# Patient Record
Sex: Female | Born: 1955 | Race: White | Hispanic: No | Smoking: Current every day smoker
Health system: Southern US, Community
[De-identification: ages and names within clinical notes are randomized; demographics above are authoritative.]

## PROBLEM LIST (undated history)

## (undated) DIAGNOSIS — K589 Irritable bowel syndrome without diarrhea: Secondary | ICD-10-CM

## (undated) DIAGNOSIS — IMO0002 Reserved for concepts with insufficient information to code with codable children: Secondary | ICD-10-CM

## (undated) DIAGNOSIS — F32A Depression, unspecified: Secondary | ICD-10-CM

## (undated) DIAGNOSIS — M549 Dorsalgia, unspecified: Secondary | ICD-10-CM

## (undated) DIAGNOSIS — G2581 Restless legs syndrome: Secondary | ICD-10-CM

## (undated) DIAGNOSIS — I10 Essential (primary) hypertension: Secondary | ICD-10-CM

## (undated) DIAGNOSIS — K5790 Diverticulosis of intestine, part unspecified, without perforation or abscess without bleeding: Secondary | ICD-10-CM

## (undated) DIAGNOSIS — M1990 Unspecified osteoarthritis, unspecified site: Secondary | ICD-10-CM

## (undated) DIAGNOSIS — M797 Fibromyalgia: Secondary | ICD-10-CM

## (undated) DIAGNOSIS — M5030 Other cervical disc degeneration, unspecified cervical region: Secondary | ICD-10-CM

## (undated) DIAGNOSIS — G8929 Other chronic pain: Secondary | ICD-10-CM

## (undated) DIAGNOSIS — M51369 Other intervertebral disc degeneration, lumbar region without mention of lumbar back pain or lower extremity pain: Secondary | ICD-10-CM

## (undated) DIAGNOSIS — F329 Major depressive disorder, single episode, unspecified: Secondary | ICD-10-CM

## (undated) DIAGNOSIS — F419 Anxiety disorder, unspecified: Secondary | ICD-10-CM

## (undated) DIAGNOSIS — M5136 Other intervertebral disc degeneration, lumbar region: Secondary | ICD-10-CM

## (undated) DIAGNOSIS — K5792 Diverticulitis of intestine, part unspecified, without perforation or abscess without bleeding: Secondary | ICD-10-CM

## (undated) HISTORY — PX: OTHER SURGICAL HISTORY: SHX169

## (undated) HISTORY — PX: ABDOMINAL HYSTERECTOMY: SHX81

---

## 1980-10-16 HISTORY — PX: ABDOMINAL HYSTERECTOMY: SHX81

## 1998-05-14 ENCOUNTER — Emergency Department (HOSPITAL_COMMUNITY): Admission: EM | Admit: 1998-05-14 | Discharge: 1998-05-14 | Payer: Self-pay | Admitting: Emergency Medicine

## 2001-04-22 ENCOUNTER — Other Ambulatory Visit: Admission: RE | Admit: 2001-04-22 | Discharge: 2001-04-22 | Payer: Self-pay | Admitting: Obstetrics and Gynecology

## 2001-04-26 ENCOUNTER — Emergency Department (HOSPITAL_COMMUNITY): Admission: EM | Admit: 2001-04-26 | Discharge: 2001-04-26 | Payer: Self-pay | Admitting: Emergency Medicine

## 2001-06-21 ENCOUNTER — Emergency Department (HOSPITAL_COMMUNITY): Admission: EM | Admit: 2001-06-21 | Discharge: 2001-06-22 | Payer: Self-pay | Admitting: *Deleted

## 2001-06-21 ENCOUNTER — Encounter: Payer: Self-pay | Admitting: *Deleted

## 2002-08-22 ENCOUNTER — Emergency Department (HOSPITAL_COMMUNITY): Admission: EM | Admit: 2002-08-22 | Discharge: 2002-08-22 | Payer: Self-pay | Admitting: Emergency Medicine

## 2003-05-29 ENCOUNTER — Ambulatory Visit (HOSPITAL_COMMUNITY): Admission: RE | Admit: 2003-05-29 | Discharge: 2003-05-29 | Payer: Self-pay | Admitting: Internal Medicine

## 2003-05-29 ENCOUNTER — Encounter: Payer: Self-pay | Admitting: Internal Medicine

## 2003-06-05 ENCOUNTER — Ambulatory Visit (HOSPITAL_COMMUNITY): Admission: RE | Admit: 2003-06-05 | Discharge: 2003-06-05 | Payer: Self-pay | Admitting: Internal Medicine

## 2003-06-05 ENCOUNTER — Encounter: Payer: Self-pay | Admitting: Internal Medicine

## 2003-08-24 ENCOUNTER — Emergency Department (HOSPITAL_COMMUNITY): Admission: AD | Admit: 2003-08-24 | Discharge: 2003-08-24 | Payer: Self-pay | Admitting: Family Medicine

## 2007-03-18 ENCOUNTER — Ambulatory Visit (HOSPITAL_COMMUNITY): Admission: RE | Admit: 2007-03-18 | Discharge: 2007-03-18 | Payer: Self-pay | Admitting: Family Medicine

## 2008-08-31 ENCOUNTER — Emergency Department (HOSPITAL_COMMUNITY): Admission: EM | Admit: 2008-08-31 | Discharge: 2008-08-31 | Payer: Self-pay | Admitting: Emergency Medicine

## 2008-09-23 ENCOUNTER — Ambulatory Visit (HOSPITAL_COMMUNITY): Admission: RE | Admit: 2008-09-23 | Discharge: 2008-09-23 | Payer: Self-pay | Admitting: Family Medicine

## 2008-10-13 ENCOUNTER — Ambulatory Visit (HOSPITAL_COMMUNITY): Admission: RE | Admit: 2008-10-13 | Discharge: 2008-10-13 | Payer: Self-pay | Admitting: Family Medicine

## 2008-10-13 ENCOUNTER — Encounter: Payer: Self-pay | Admitting: Orthopedic Surgery

## 2008-11-02 ENCOUNTER — Ambulatory Visit: Payer: Self-pay | Admitting: Orthopedic Surgery

## 2008-11-02 DIAGNOSIS — M25469 Effusion, unspecified knee: Secondary | ICD-10-CM

## 2008-11-02 DIAGNOSIS — M25569 Pain in unspecified knee: Secondary | ICD-10-CM

## 2008-11-02 DIAGNOSIS — M1710 Unilateral primary osteoarthritis, unspecified knee: Secondary | ICD-10-CM

## 2008-11-02 DIAGNOSIS — IMO0002 Reserved for concepts with insufficient information to code with codable children: Secondary | ICD-10-CM | POA: Insufficient documentation

## 2008-11-04 ENCOUNTER — Telehealth: Payer: Self-pay | Admitting: Orthopedic Surgery

## 2008-11-11 ENCOUNTER — Ambulatory Visit (HOSPITAL_COMMUNITY): Admission: RE | Admit: 2008-11-11 | Discharge: 2008-11-11 | Payer: Self-pay | Admitting: Family Medicine

## 2008-11-27 ENCOUNTER — Encounter: Payer: Self-pay | Admitting: Orthopedic Surgery

## 2008-12-14 ENCOUNTER — Ambulatory Visit: Payer: Self-pay | Admitting: Orthopedic Surgery

## 2008-12-14 ENCOUNTER — Telehealth: Payer: Self-pay | Admitting: Orthopedic Surgery

## 2008-12-21 ENCOUNTER — Ambulatory Visit: Payer: Self-pay | Admitting: Orthopedic Surgery

## 2008-12-28 ENCOUNTER — Ambulatory Visit: Payer: Self-pay | Admitting: Orthopedic Surgery

## 2009-01-08 ENCOUNTER — Encounter: Payer: Self-pay | Admitting: Orthopedic Surgery

## 2009-02-03 ENCOUNTER — Ambulatory Visit: Payer: Self-pay | Admitting: Orthopedic Surgery

## 2009-06-23 ENCOUNTER — Encounter: Payer: Self-pay | Admitting: Orthopedic Surgery

## 2009-07-09 ENCOUNTER — Ambulatory Visit (HOSPITAL_COMMUNITY): Admission: RE | Admit: 2009-07-09 | Discharge: 2009-07-09 | Payer: Self-pay | Admitting: Family Medicine

## 2009-07-20 ENCOUNTER — Encounter: Payer: Self-pay | Admitting: Internal Medicine

## 2009-08-04 ENCOUNTER — Ambulatory Visit: Payer: Self-pay | Admitting: Orthopedic Surgery

## 2009-08-04 ENCOUNTER — Encounter: Admission: RE | Admit: 2009-08-04 | Discharge: 2009-08-04 | Payer: Self-pay | Admitting: Neurosurgery

## 2009-08-09 ENCOUNTER — Ambulatory Visit: Payer: Self-pay | Admitting: Internal Medicine

## 2009-08-23 ENCOUNTER — Ambulatory Visit: Payer: Self-pay | Admitting: Internal Medicine

## 2009-09-20 ENCOUNTER — Encounter: Admission: RE | Admit: 2009-09-20 | Discharge: 2009-09-20 | Payer: Self-pay | Admitting: Neurosurgery

## 2009-10-13 ENCOUNTER — Ambulatory Visit (HOSPITAL_COMMUNITY): Admission: RE | Admit: 2009-10-13 | Discharge: 2009-10-13 | Payer: Self-pay | Admitting: Family Medicine

## 2009-11-02 ENCOUNTER — Encounter: Admission: RE | Admit: 2009-11-02 | Discharge: 2009-11-02 | Payer: Self-pay | Admitting: Neurosurgery

## 2009-12-10 ENCOUNTER — Emergency Department (HOSPITAL_COMMUNITY): Admission: EM | Admit: 2009-12-10 | Discharge: 2009-12-10 | Payer: Self-pay | Admitting: Emergency Medicine

## 2009-12-13 ENCOUNTER — Ambulatory Visit (HOSPITAL_COMMUNITY): Admission: RE | Admit: 2009-12-13 | Discharge: 2009-12-13 | Payer: Self-pay | Admitting: Family Medicine

## 2010-01-11 ENCOUNTER — Ambulatory Visit: Payer: Self-pay | Admitting: Orthopedic Surgery

## 2010-01-11 DIAGNOSIS — M479 Spondylosis, unspecified: Secondary | ICD-10-CM | POA: Insufficient documentation

## 2010-01-11 DIAGNOSIS — M5126 Other intervertebral disc displacement, lumbar region: Secondary | ICD-10-CM

## 2010-01-11 DIAGNOSIS — M549 Dorsalgia, unspecified: Secondary | ICD-10-CM | POA: Insufficient documentation

## 2010-01-11 DIAGNOSIS — M4800 Spinal stenosis, site unspecified: Secondary | ICD-10-CM

## 2010-02-04 ENCOUNTER — Ambulatory Visit (HOSPITAL_COMMUNITY): Admission: RE | Admit: 2010-02-04 | Discharge: 2010-02-04 | Payer: Self-pay | Admitting: Neurosurgery

## 2010-03-01 ENCOUNTER — Encounter: Admission: RE | Admit: 2010-03-01 | Discharge: 2010-03-01 | Payer: Self-pay | Admitting: Neurosurgery

## 2010-04-07 ENCOUNTER — Encounter: Payer: Self-pay | Admitting: Orthopedic Surgery

## 2010-04-26 ENCOUNTER — Encounter: Payer: Self-pay | Admitting: Orthopedic Surgery

## 2010-07-26 ENCOUNTER — Encounter: Admission: RE | Admit: 2010-07-26 | Discharge: 2010-07-26 | Payer: Self-pay | Admitting: Neurosurgery

## 2010-08-29 ENCOUNTER — Emergency Department (HOSPITAL_COMMUNITY): Admission: EM | Admit: 2010-08-29 | Discharge: 2010-08-29 | Payer: Self-pay | Admitting: Emergency Medicine

## 2010-10-04 ENCOUNTER — Encounter
Admission: RE | Admit: 2010-10-04 | Discharge: 2010-10-04 | Payer: Self-pay | Source: Home / Self Care | Attending: Neurosurgery | Admitting: Neurosurgery

## 2010-11-15 NOTE — Letter (Signed)
Summary: [facility] discount letter  [facility] discount letter   Imported By: Cammie Sickle 01/11/2010 13:51:42  _____________________________________________________________________  External Attachment:    Type:   Image     Comment:   External Document

## 2010-11-15 NOTE — Letter (Signed)
Summary: Generic Letter  [facility]  [address]  [place], [place] [address]   Phone: [phone]  Fax: [phone]    04/26/2010  Kris BATES [address] [place], [place]  [address]  History of Present Illness: 55 year old female with multiple problems which include depression, fibromyalgia, arthritis of her knees, chronic pain, small herniated disc at L4-L5 and L5 and S1 with central canal stenosis moderate at L4 and 5, degenerative facet arthritis at L3-L4 L4-L5 and L5-S1.    She has some mild arthritis on x-ray.  Meds: Zanaflex, Norco 10, Relafen 750, Potassium,   (tried Lyrica)     Impression & Recommendations:  Problem # 1:  KNEE, ARTHRITIS, DEGEN./OSTEO (ICD-715.96)  This is not a patient who is a surgical candidate because of her depression, fibromyalgia, and chronic pain.  Her x-rays do not warrant knee surgery at this time.   Sincerely,   Fuller Canada MD

## 2010-11-15 NOTE — Assessment & Plan Note (Signed)
Summary: bi knee pain needs xr/[facility] discount/bsf   Visit Type:  Follow-up Referring Provider:  self  CC:  bilateral knee OA.  History of Present Illness: 55 year old female with multiple problems which include depression, fibromyalgia, arthritis of her knees, chronic pain, small herniated disc at L4-L5 and L5 and S1 with central canal stenosis moderate at L4 and 5, degenerative facet arthritis at L3-L4 L4-L5 and L5-S1.  He comes in today complaining that her legs are cramping her LEFT leg is giving way and she has severe increasing pain radiating down her legs associated with numbness and tingling  She has had epidural injections or it she's had injections in her knees.  She's had MRIs of her knees there is no surgical lesion there.  She has some mild arthritis on x-ray.  Meds: Zanaflex, Norco 10, Relafen 750, Potassium,   (tried Lyrica)    Has cramping pain in both legs, makes her scream ,she can see her muscles in her legs move.  Pain goes down bilateral legs, Dr. Channing Mutters is back specialist. Sees him 21st of April   Allergies: 1)  ! Sulfa  Past History:  Past Medical History: Last updated: 11/02/2008 fibromyalgia right leg is 1 inch shorter than left arthritis chronic back pain  Past Surgical History: Last updated: 11/02/2008 hysterectomy  Family History: Last updated: 11/02/2008 FH of Cancer:  Family History of Diabetes Family History of Arthritis Hx, family, asthma  Social History: Last updated: 11/02/2008 Patient is married.  unemployed  Risk Factors: Caffeine Use: 2 (11/02/2008)  Risk Factors: Smoking Status: current (11/02/2008) Packs/Day: 1/2 (11/02/2008)  Review of Systems      See HPI  Physical Exam  Additional Exam:  she appears depressed today.  Her mood is flat.  She joined to time person place.  I did check her knee to make sure there was no ligament damage  She has full range of motion no joint effusion no real tenderness.  Collateral  ligaments were stable at zero and 30.  Lachman was normal.  ACL and PCL drawer test were normal.  Skin overlying the knee was normal.  She had a good pulse in the LEFT lower extremity.     Impression & Recommendations:  Problem # 1:  KNEE, ARTHRITIS, DEGEN./OSTEO (ICD-715.96)  stable continue anti-inflammatories.  This is not a patient who is a surgical candidate because of her depression, fibromyalgia, and chronic pain.  Her x-rays do not want surgery at this time.   Her updated medication list for this problem includes:    Norco 10-325 Mg Tabs (Hydrocodone-acetaminophen) ..... One by mouth q 6 hrs as needed pain    Nabumetone 750 Mg Tabs (Nabumetone) .Marland Kitchen... 1 p two times a day  Orders: Est. Patient Level III ([address])  Problem # 2:  SPINAL STENOSIS (ICD-724.00) Assessment: Deteriorated  some of her back problem as spinal stenosis from multiple causes  She also has herniated disc  Recommended to Dr. Channing Mutters in April.  I did start her on Neurontin 100 mg t.i.d.  Orders: Est. Patient Level III ([address])  Medications Added to Medication List This Visit: 1)  Neurontin 100 Mg Caps (Gabapentin) .Marland Kitchen.. 1 by mouth three times a day  Patient Instructions: 1)  See Dr Channing Mutters in April  2)  Please schedule a follow-up appointment as needed. Prescriptions: NEURONTIN 100 MG CAPS (GABAPENTIN) 1 by mouth three times a day  #60 x 2   Entered and Authorized by:   Fuller Canada MD   Signed by:  Fuller Canada MD on 01/11/2010   Method used:   Print then Give to Patient   RxID:   [id number]

## 2010-11-15 NOTE — Letter (Signed)
Summary: [facility] office note Dr Dessie Coma office note Dr Channing Mutters   Imported By: Cammie Sickle 04/21/2010 18:38:33  _____________________________________________________________________  External Attachment:    Type:   Image     Comment:   External Document

## 2010-11-22 ENCOUNTER — Other Ambulatory Visit (HOSPITAL_COMMUNITY): Payer: Self-pay | Admitting: Internal Medicine

## 2010-11-22 DIAGNOSIS — Z139 Encounter for screening, unspecified: Secondary | ICD-10-CM

## 2010-11-28 ENCOUNTER — Ambulatory Visit (HOSPITAL_COMMUNITY): Admission: RE | Admit: 2010-11-28 | Payer: Self-pay | Source: Ambulatory Visit

## 2010-11-28 ENCOUNTER — Ambulatory Visit (HOSPITAL_COMMUNITY)
Admission: RE | Admit: 2010-11-28 | Discharge: 2010-11-28 | Disposition: A | Discharge status: Home / Self Care | Payer: Self-pay | Source: Ambulatory Visit | Attending: Internal Medicine | Admitting: Internal Medicine

## 2010-11-28 DIAGNOSIS — Z139 Encounter for screening, unspecified: Secondary | ICD-10-CM

## 2010-11-28 DIAGNOSIS — Z1231 Encounter for screening mammogram for malignant neoplasm of breast: Secondary | ICD-10-CM | POA: Insufficient documentation

## 2011-01-06 LAB — URINALYSIS, ROUTINE W REFLEX MICROSCOPIC
Bilirubin Urine: NEGATIVE
Urobilinogen, UA: 0.2 mg/dL (ref 0.0–1.0)
pH: 7.5 (ref 5.0–8.0)

## 2011-06-05 ENCOUNTER — Other Ambulatory Visit (HOSPITAL_COMMUNITY): Payer: Self-pay | Admitting: Internal Medicine

## 2011-06-05 ENCOUNTER — Ambulatory Visit (HOSPITAL_COMMUNITY)
Admission: RE | Admit: 2011-06-05 | Discharge: 2011-06-05 | Disposition: A | Discharge status: Home / Self Care | Payer: Medicare Other | Source: Ambulatory Visit | Attending: Internal Medicine | Admitting: Internal Medicine

## 2011-06-05 DIAGNOSIS — K6289 Other specified diseases of anus and rectum: Secondary | ICD-10-CM | POA: Insufficient documentation

## 2011-06-05 DIAGNOSIS — R1031 Right lower quadrant pain: Secondary | ICD-10-CM | POA: Insufficient documentation

## 2011-06-05 DIAGNOSIS — R1032 Left lower quadrant pain: Secondary | ICD-10-CM | POA: Insufficient documentation

## 2011-06-05 MED ORDER — IOHEXOL 300 MG/ML  SOLN
100.0000 mL | Freq: Once | INTRAMUSCULAR | Status: AC | PRN
  Administered 2011-06-05: 100 mL via INTRAVENOUS

## 2011-06-06 ENCOUNTER — Other Ambulatory Visit (HOSPITAL_COMMUNITY): Payer: Self-pay | Admitting: General Surgery

## 2011-06-08 ENCOUNTER — Ambulatory Visit (HOSPITAL_COMMUNITY)
Admission: RE | Admit: 2011-06-08 | Discharge: 2011-06-08 | Disposition: A | Discharge status: Home / Self Care | Payer: Medicare Other | Source: Ambulatory Visit | Attending: General Surgery | Admitting: General Surgery

## 2011-06-08 ENCOUNTER — Ambulatory Visit (HOSPITAL_COMMUNITY): Payer: Medicare Other

## 2011-06-08 DIAGNOSIS — R9389 Abnormal findings on diagnostic imaging of other specified body structures: Secondary | ICD-10-CM | POA: Insufficient documentation

## 2011-06-19 NOTE — Consult Note (Signed)
NAMEMICHAL, [name]                 ACCOUNT NO.:  [id number]  MEDICAL RECORD NO.:  [id number]  LOCATION:                                 FACILITY:  PHYSICIAN:  Barbaraann Barthel, M.D. DATE OF BIRTH:  [birth year]  DATE OF CONSULTATION:  06/19/2011 DATE OF DISCHARGE:                                CONSULTATION   DIAGNOSIS:  Pelvic mass.  HISTORY OF PRESENT MEDICAL ILLNESS:  The patient has had approximately a 3-week history of lower abdominal pain.  This was unaccompanied with any change in her appetite or bowel and this was unaccompanied with any history of fever and she gives no past GI history of diverticulitis. She also gives a history of endometriosis in the past in the 1980s.  In essence, she was seen by Dr. Sharyon Medicus PA.  A CT scan of the abdomen showed mass in her pelvis.  She was referred to me.  I further worked her up with a sonogram and tried to find out details of her previous history.  In essence, despite the fact that there was no history of any purging of any records, I was unable through multiple attempts to obtain any kind of pathology reports or surgical notes from the offices which used to be in the offices of Dr. Rolene Course, her physician in [place] who has recently retired.  [facility] which is now [facility] has no records of her previous surgery and can provide me no pathology reports after multiple attempts of trying to obtain these.  At any rate, she gives the history in 1982 of having had surgery for endometriosis and she underwent a total abdominal hysterectomy at that time.  She believes that she also had a bilateral salpingo-oophorectomy, although she is not certain of that fact.  With the following presentations and the findings of a complex cystic mass in her pelvis, I would raise the question of possibly having had a retained ovary which had undergone some cystic degeneration, however, we were not able to obtain any  records regarding this.  The CT scan and the sonogram, however, do not reveal any active diverticulitis or any findings suggestive of colonic mass or abscess.  Also of note is the fact that she had undergone a normal colonoscopy in 2010.  I discussed this case with the radiologist and my feeling was that this was possibly a ovarian cyst despite our inability to produce any records or possibly some cystic changes from her previous surgery and I discussed this case also with Dr. Despina Hidden who graciously has accepted my imitation to scrub in with me when I perform a laparotomy on this patient.  PHYSICAL EXAMINATION:  GENERAL:  She is a pleasant, somewhat apprehensive and nervous 55 year old white female who is 5 feet 4 inches and she weighs 198 pounds. VITAL SIGNS:  Her temperature is 98.0, her pulse is 60, respirations are 12, and blood pressure is 100/58. HEENT:  Head is normocephalic.  Extraocular movements are intact.  Her pupils are equal and reactive to light and accommodation. NECK:  Supple and cylindrical without jugular vein distention, thyromegaly, or tracheal deviation and there are no bruits auscultated and no  adenopathy appreciated. BREASTS:  Negative for masses and axillae are without any masses. HEART:  Regular.  No murmurs are appreciated. ABDOMEN:  The patient is tender in her lower abdomen. PELVIC: On speculum examination, there is absence of any retained uterine tissue.  On digital examination, there is an obvious mass that is palpable and tender.  This is also palpable on rectal examination. The stool itself is guaiac-negative.  EXTREMITIES:  Within normal limits.  She does have chronic back pain, however. NEUROLOGIC:  The patient has no history of migraines or seizures.  She does have a history of depression. ENDOCRINE SYSTEM:  No history of diabetes or thyroid disease. CARDIOPULMONARY SYSTEM:  She has a history of hypertension and she does smoke at least a half-a-pack of  cigarettes per day.  She has no history of alcohol abuse. MUSCULOSKELETAL SYSTEM:  The patient has chronic back pain.  She has undergone at least 6 injections in her back for pain and injections in her knees. OB/GYN:  She is a gravida 2, para 2, abortus zero, cesarean zero female whose last menstrual period was in 1982.  She has no family history of breast carcinoma and as stated she has in 1982 undergone a TAH, possibly BSO in 1982 for endometriosis.  Her last mammogram was normal in 2012. GI SYSTEM:  No history of hepatitis.  She has some chronic constipation and at times loose stools.  She relates these to possible dietary indiscretion and possibly some of her medications for depression.  She has no history of bright red rectal bleeding or melena.  No history of inflammatory bowel disease or spastic colon and no history of unexplained weight loss.  As stated, she had a normal colonoscopy in 2012. GU SYSTEM:  No history of dysuria or frequency or history of kidney stones.  MEDICATIONS:  The patient is allergic to SULFA and she takes bisacodyl 5 mg 2-4 daily, ciprofloxacin she was taking for what was thought to be possible intra-abdominal abscess at 500 mg daily b.i.d., oxycodone 10/325 she takes for her chronic back pain and metronidazole one twice a day as well.  When she was first seen, she was taking Flagyl and Cipro for possible intra-abdominal abscess.  This has not been borne out as stated above with CT scans and vaginal ultrasounds.  LABORATORY DATA:  Laboratory data did not show any sign of any acute inflammation with a normal white count of 8.7 and H and H of 13.2 and 40.5 with a normal differential.  Her metabolic 7 was also grossly within normal limits.  As stated, her CT scan showed a 3.8 x 5.8 x 3.5 cm complex cystic lesion, this was found to be in the posterior pelvis.  PLAN:  Therefore, this patient will be admitted via the outpatient department.  We will give her  parenteral antibiotics and have enemas or some modified bowel prep prior to exploration of this pelvic mass.  I have also discussed this with Dr. Rito Ehrlich who will place ureteral stents preoperatively and Dr. Despina Hidden who will assist me in the surgery itself.     Barbaraann Barthel, M.D.     WB/MEDQ  D:  06/19/2011  T:  06/19/2011  Job:  [id number]  cc:   Madelin Rear. Sherwood Gambler, MD Fax: [phone]  Lazaro Arms, M.D. Fax: [phone]  Hollice Espy, M.D.

## 2011-06-23 ENCOUNTER — Encounter (HOSPITAL_COMMUNITY): Payer: Self-pay

## 2011-06-23 ENCOUNTER — Encounter (HOSPITAL_COMMUNITY)
Admission: RE | Admit: 2011-06-23 | Discharge: 2011-06-23 | Disposition: A | Discharge status: Home / Self Care | Payer: Medicare Other | Source: Ambulatory Visit | Attending: General Surgery | Admitting: General Surgery

## 2011-06-23 ENCOUNTER — Other Ambulatory Visit: Payer: Self-pay

## 2011-06-23 HISTORY — DX: Dorsalgia, unspecified: M54.9

## 2011-06-23 HISTORY — DX: Restless legs syndrome: G25.81

## 2011-06-23 HISTORY — DX: Fibromyalgia: M79.7

## 2011-06-23 HISTORY — DX: Other chronic pain: G89.29

## 2011-06-23 HISTORY — DX: Anxiety disorder, unspecified: F41.9

## 2011-06-23 HISTORY — DX: Essential (primary) hypertension: I10

## 2011-06-23 HISTORY — DX: Depression, unspecified: F32.A

## 2011-06-23 HISTORY — DX: Irritable bowel syndrome, unspecified: K58.9

## 2011-06-23 HISTORY — DX: Major depressive disorder, single episode, unspecified: F32.9

## 2011-06-23 LAB — DIFFERENTIAL
Basophils Absolute: 0.1 10*3/uL (ref 0.0–0.1)
Eosinophils Relative: 3 % (ref 0–5)
Lymphocytes Relative: 32 % (ref 12–46)
Monocytes Absolute: 0.7 10*3/uL (ref 0.1–1.0)

## 2011-06-23 LAB — BASIC METABOLIC PANEL
BUN: 9 mg/dL (ref 6–23)
CO2: 26 mEq/L (ref 19–32)
Calcium: 9.5 mg/dL (ref 8.4–10.5)
Creatinine, Ser: 0.68 mg/dL (ref 0.50–1.10)

## 2011-06-23 LAB — CBC
HCT: 40.2 % (ref 36.0–46.0)
MCHC: 33.3 g/dL (ref 30.0–36.0)
MCV: 92.2 fL (ref 78.0–100.0)
RDW: 13.4 % (ref 11.5–15.5)

## 2011-06-23 LAB — SURGICAL PCR SCREEN: MRSA, PCR: NEGATIVE

## 2011-06-23 NOTE — Patient Instructions (Signed)
20 [name]  06/23/2011   Your procedure is scheduled on:  06/30/11  Report to [facility] at [place] AM.  Call this number if you have problems the morning of surgery: [phone]   Remember:   Do not eat food:After Midnight.  Do not drink clear liquids: After Midnight.  Take these medicines the morning of surgery with A SIP OF WATER: prozac, klonopin, lopressor & enalapril   Do not wear jewelry, make-up or nail polish.  Do not wear lotions, powders, or perfumes. You may wear deodorant.  Do not shave 48 hours prior to surgery.  Do not bring valuables to the hospital.  Contacts, dentures or bridgework may not be worn into surgery.  Leave suitcase in the car. After surgery it may be brought to your room.  For patients admitted to the hospital, checkout time is 11:00 AM the day of discharge.   Patients discharged the day of surgery will not be allowed to drive home.  Name and phone number of your driver: family  Special Instructions: CHG Shower Use Special Wash: 1/2 bottle night before surgery and 1/2 bottle morning of surgery.   Please read over the following fact sheets that you were given: Pain Booklet, MRSA Information, Surgical Site Infection Prevention, Anesthesia Post-op Instructions and Care and Recovery After Surgery   PATIENT INSTRUCTIONS POST-ANESTHESIA  IMMEDIATELY FOLLOWING SURGERY:  Do not drive or operate machinery for the first twenty four hours after surgery.  Do not make any important decisions for twenty four hours after surgery or while taking narcotic pain medications or sedatives.  If you develop intractable nausea and vomiting or a severe headache please notify your doctor immediately.  FOLLOW-UP:  Please make an appointment with your surgeon as instructed. You do not need to follow up with anesthesia unless specifically instructed to do so.  WOUND CARE INSTRUCTIONS (if applicable):  Keep a dry clean dressing on the anesthesia/puncture wound site if there is  drainage.  Once the wound has quit draining you may leave it open to air.  Generally you should leave the bandage intact for twenty four hours unless there is drainage.  If the epidural site drains for more than 36-48 hours please call the anesthesia department.  QUESTIONS?:  Please feel free to call your physician or the hospital operator if you have any questions, and they will be happy to assist you.     [facility] Anesthesia Department [address] [place] [phone]

## 2011-06-30 ENCOUNTER — Encounter (HOSPITAL_COMMUNITY): Payer: Self-pay | Admitting: Anesthesiology

## 2011-06-30 ENCOUNTER — Inpatient Hospital Stay (HOSPITAL_COMMUNITY): Payer: Medicare Other | Admitting: Anesthesiology

## 2011-06-30 ENCOUNTER — Encounter (HOSPITAL_COMMUNITY)
Admission: RE | Disposition: A | Discharge status: Home / Self Care | Payer: Self-pay | Source: Ambulatory Visit | Attending: General Surgery

## 2011-06-30 ENCOUNTER — Other Ambulatory Visit: Payer: Self-pay | Admitting: General Surgery

## 2011-06-30 ENCOUNTER — Encounter (HOSPITAL_COMMUNITY): Payer: Self-pay | Admitting: *Deleted

## 2011-06-30 ENCOUNTER — Inpatient Hospital Stay (HOSPITAL_COMMUNITY)
Admission: RE | Admit: 2011-06-30 | Discharge: 2011-07-03 | DRG: 750 | Disposition: A | Discharge status: Home / Self Care | Payer: Medicare Other | Source: Ambulatory Visit | Attending: General Surgery | Admitting: General Surgery

## 2011-06-30 DIAGNOSIS — M25469 Effusion, unspecified knee: Secondary | ICD-10-CM

## 2011-06-30 DIAGNOSIS — M5126 Other intervertebral disc displacement, lumbar region: Secondary | ICD-10-CM

## 2011-06-30 DIAGNOSIS — M4800 Spinal stenosis, site unspecified: Secondary | ICD-10-CM

## 2011-06-30 DIAGNOSIS — Z01812 Encounter for preprocedural laboratory examination: Secondary | ICD-10-CM

## 2011-06-30 DIAGNOSIS — N803 Endometriosis of pelvic peritoneum, unspecified: Principal | ICD-10-CM | POA: Diagnosis present

## 2011-06-30 DIAGNOSIS — I10 Essential (primary) hypertension: Secondary | ICD-10-CM | POA: Diagnosis present

## 2011-06-30 DIAGNOSIS — G8929 Other chronic pain: Secondary | ICD-10-CM | POA: Diagnosis present

## 2011-06-30 DIAGNOSIS — F3289 Other specified depressive episodes: Secondary | ICD-10-CM | POA: Diagnosis present

## 2011-06-30 DIAGNOSIS — Z01810 Encounter for preprocedural cardiovascular examination: Secondary | ICD-10-CM

## 2011-06-30 DIAGNOSIS — F329 Major depressive disorder, single episode, unspecified: Secondary | ICD-10-CM | POA: Diagnosis present

## 2011-06-30 DIAGNOSIS — M25569 Pain in unspecified knee: Secondary | ICD-10-CM

## 2011-06-30 DIAGNOSIS — M479 Spondylosis, unspecified: Secondary | ICD-10-CM

## 2011-06-30 DIAGNOSIS — G2581 Restless legs syndrome: Secondary | ICD-10-CM | POA: Diagnosis present

## 2011-06-30 DIAGNOSIS — M1710 Unilateral primary osteoarthritis, unspecified knee: Secondary | ICD-10-CM

## 2011-06-30 DIAGNOSIS — M549 Dorsalgia, unspecified: Secondary | ICD-10-CM | POA: Diagnosis present

## 2011-06-30 DIAGNOSIS — N35919 Unspecified urethral stricture, male, unspecified site: Secondary | ICD-10-CM | POA: Diagnosis present

## 2011-06-30 HISTORY — PX: CYSTOSCOPY W/ URETERAL STENT PLACEMENT: SHX1429

## 2011-06-30 HISTORY — PX: LAPAROTOMY: SHX154

## 2011-06-30 SURGERY — LAPAROTOMY, EXPLORATORY
Anesthesia: General | Wound class: Clean

## 2011-06-30 MED ORDER — ONDANSETRON HCL 4 MG/2ML IJ SOLN
4.0000 mg | Freq: Once | INTRAMUSCULAR | Status: AC
  Administered 2011-06-30: 4 mg via INTRAVENOUS

## 2011-06-30 MED ORDER — GLYCOPYRROLATE 0.2 MG/ML IJ SOLN
INTRAMUSCULAR | Status: AC
  Administered 2011-06-30: 0.2 mg via INTRAVENOUS
  Filled 2011-06-30: qty 1

## 2011-06-30 MED ORDER — FENTANYL CITRATE 0.05 MG/ML IJ SOLN
INTRAMUSCULAR | Status: AC
  Administered 2011-06-30: 50 ug via INTRAVENOUS
  Filled 2011-06-30: qty 2

## 2011-06-30 MED ORDER — DEXTROSE 5 % IV SOLN
INTRAVENOUS | Status: AC
  Filled 2011-06-30: qty 1

## 2011-06-30 MED ORDER — FENTANYL CITRATE 0.05 MG/ML IJ SOLN
INTRAMUSCULAR | Status: DC | PRN
  Administered 2011-06-30 (×2): 50 ug via INTRAVENOUS

## 2011-06-30 MED ORDER — ACETAMINOPHEN 650 MG RE SUPP
650.0000 mg | Freq: Four times a day (QID) | RECTAL | Status: DC | PRN
  Administered 2011-07-02: 650 mg via RECTAL
  Filled 2011-06-30: qty 1

## 2011-06-30 MED ORDER — GLYCOPYRROLATE 0.2 MG/ML IJ SOLN
0.2000 mg | Freq: Once | INTRAMUSCULAR | Status: AC | PRN
  Administered 2011-06-30: 0.2 mg via INTRAVENOUS

## 2011-06-30 MED ORDER — SODIUM CHLORIDE 0.9 % IV SOLN
INTRAVENOUS | Status: DC
  Administered 2011-06-30 – 2011-07-01 (×2): via INTRAVENOUS
  Administered 2011-07-01 (×2): 1000 mL via INTRAVENOUS
  Administered 2011-07-02 – 2011-07-03 (×4): via INTRAVENOUS

## 2011-06-30 MED ORDER — SUFENTANIL CITRATE 50 MCG/ML IV SOLN
INTRAVENOUS | Status: AC
  Filled 2011-06-30: qty 1

## 2011-06-30 MED ORDER — LORAZEPAM 2 MG/ML IJ SOLN
0.5000 mg | Freq: Four times a day (QID) | INTRAMUSCULAR | Status: DC | PRN
  Administered 2011-06-30 – 2011-07-03 (×4): 0.5 mg via INTRAVENOUS
  Filled 2011-06-30 (×4): qty 1

## 2011-06-30 MED ORDER — ACETAMINOPHEN 325 MG PO TABS
650.0000 mg | ORAL_TABLET | Freq: Four times a day (QID) | ORAL | Status: DC | PRN
  Administered 2011-07-01 – 2011-07-03 (×3): 650 mg via ORAL
  Filled 2011-06-30 (×3): qty 2

## 2011-06-30 MED ORDER — FLUOXETINE HCL 20 MG PO CAPS
20.0000 mg | ORAL_CAPSULE | Freq: Two times a day (BID) | ORAL | Status: DC
  Administered 2011-06-30 – 2011-07-03 (×6): 20 mg via ORAL
  Filled 2011-06-30 (×6): qty 1

## 2011-06-30 MED ORDER — GLYCOPYRROLATE 0.2 MG/ML IJ SOLN
INTRAMUSCULAR | Status: AC
  Filled 2011-06-30: qty 1

## 2011-06-30 MED ORDER — GLYCOPYRROLATE 0.2 MG/ML IJ SOLN
INTRAMUSCULAR | Status: DC | PRN
  Administered 2011-06-30 (×3): 0.2 mg via INTRAVENOUS

## 2011-06-30 MED ORDER — NEOSTIGMINE METHYLSULFATE 1 MG/ML IJ SOLN
INTRAMUSCULAR | Status: AC
  Filled 2011-06-30: qty 10

## 2011-06-30 MED ORDER — PROPOFOL 10 MG/ML IV EMUL
INTRAVENOUS | Status: DC | PRN
  Administered 2011-06-30: 120 mg via INTRAVENOUS

## 2011-06-30 MED ORDER — PROMETHAZINE HCL 25 MG RE SUPP
25.0000 mg | Freq: Four times a day (QID) | RECTAL | Status: DC | PRN
  Administered 2011-06-30 – 2011-07-02 (×3): 25 mg via RECTAL
  Filled 2011-06-30 (×3): qty 1

## 2011-06-30 MED ORDER — FENTANYL CITRATE 0.05 MG/ML IJ SOLN
25.0000 ug | INTRAMUSCULAR | Status: DC | PRN
  Administered 2011-06-30 (×3): 50 ug via INTRAVENOUS

## 2011-06-30 MED ORDER — ENALAPRIL MALEATE 5 MG PO TABS
5.0000 mg | ORAL_TABLET | Freq: Every day | ORAL | Status: DC
  Administered 2011-07-01 – 2011-07-03 (×3): 5 mg via ORAL
  Filled 2011-06-30 (×3): qty 1

## 2011-06-30 MED ORDER — ONDANSETRON HCL 4 MG/2ML IJ SOLN
4.0000 mg | Freq: Four times a day (QID) | INTRAMUSCULAR | Status: DC | PRN
  Administered 2011-06-30 – 2011-07-02 (×3): 4 mg via INTRAVENOUS
  Filled 2011-06-30 (×4): qty 2

## 2011-06-30 MED ORDER — CLONAZEPAM 0.5 MG PO TABS: 0.5000 mg | ORAL_TABLET | Freq: Every evening | ORAL | Status: DC | PRN

## 2011-06-30 MED ORDER — MORPHINE SULFATE 2 MG/ML IJ SOLN
2.0000 mg | INTRAMUSCULAR | Status: DC | PRN
  Administered 2011-06-30 – 2011-07-01 (×7): 2 mg via INTRAVENOUS
  Filled 2011-06-30 (×7): qty 1

## 2011-06-30 MED ORDER — METOPROLOL TARTRATE 25 MG PO TABS
25.0000 mg | ORAL_TABLET | Freq: Two times a day (BID) | ORAL | Status: DC
  Administered 2011-06-30 – 2011-07-03 (×6): 25 mg via ORAL
  Filled 2011-06-30 (×6): qty 1

## 2011-06-30 MED ORDER — SUFENTANIL CITRATE 50 MCG/ML IV SOLN
INTRAVENOUS | Status: DC | PRN
  Administered 2011-06-30 (×3): 5 ug via INTRAVENOUS
  Administered 2011-06-30: 10 ug via INTRAVENOUS
  Administered 2011-06-30: 15 ug via INTRAVENOUS
  Administered 2011-06-30: 10 ug via INTRAVENOUS

## 2011-06-30 MED ORDER — LACTATED RINGERS IV SOLN: INTRAVENOUS | Status: DC

## 2011-06-30 MED ORDER — VECURONIUM BROMIDE 10 MG IV SOLR
INTRAVENOUS | Status: DC | PRN
  Administered 2011-06-30: 1 mg via INTRAVENOUS
  Administered 2011-06-30: 6 mg via INTRAVENOUS
  Administered 2011-06-30: 3 mg via INTRAVENOUS

## 2011-06-30 MED ORDER — MIDAZOLAM HCL 2 MG/2ML IJ SOLN
INTRAMUSCULAR | Status: AC
  Administered 2011-06-30: 2 mg via INTRAVENOUS
  Filled 2011-06-30: qty 2

## 2011-06-30 MED ORDER — MIDAZOLAM HCL 2 MG/2ML IJ SOLN
1.0000 mg | INTRAMUSCULAR | Status: DC | PRN
  Administered 2011-06-30: 2 mg via INTRAVENOUS

## 2011-06-30 MED ORDER — ACETAMINOPHEN 325 MG PO TABS: 325.0000 mg | ORAL_TABLET | ORAL | Status: DC | PRN

## 2011-06-30 MED ORDER — SODIUM CHLORIDE 0.9 % IR SOLN
Status: DC | PRN
  Administered 2011-06-30: 1000 mL

## 2011-06-30 MED ORDER — NICOTINE 14 MG/24HR TD PT24
14.0000 mg | MEDICATED_PATCH | Freq: Every day | TRANSDERMAL | Status: DC
  Administered 2011-07-02 – 2011-07-03 (×2): 14 mg via TRANSDERMAL
  Filled 2011-06-30 (×3): qty 1

## 2011-06-30 MED ORDER — PROPOFOL 10 MG/ML IV EMUL
INTRAVENOUS | Status: AC
  Filled 2011-06-30: qty 20

## 2011-06-30 MED ORDER — LIDOCAINE HCL (PF) 1 % IJ SOLN
INTRAMUSCULAR | Status: AC
  Filled 2011-06-30: qty 5

## 2011-06-30 MED ORDER — ONDANSETRON HCL 4 MG/2ML IJ SOLN: 4.0000 mg | Freq: Once | INTRAMUSCULAR | Status: DC | PRN

## 2011-06-30 MED ORDER — ONDANSETRON HCL 4 MG/2ML IJ SOLN
INTRAMUSCULAR | Status: AC
  Administered 2011-06-30: 4 mg via INTRAVENOUS
  Filled 2011-06-30: qty 2

## 2011-06-30 MED ORDER — DEXTROSE 5 % IV SOLN
1.0000 g | Freq: Once | INTRAVENOUS | Status: DC
  Filled 2011-06-30: qty 1

## 2011-06-30 MED ORDER — MORPHINE SULFATE 2 MG/ML IJ SOLN
1.0000 mg | INTRAMUSCULAR | Status: DC | PRN
  Administered 2011-06-30: 1 mg via INTRAVENOUS
  Filled 2011-06-30: qty 1

## 2011-06-30 MED ORDER — NEOSTIGMINE METHYLSULFATE 1 MG/ML IJ SOLN
INTRAMUSCULAR | Status: DC | PRN
  Administered 2011-06-30 (×2): 1.25 mg via INTRAMUSCULAR

## 2011-06-30 MED ORDER — LACTATED RINGERS IV SOLN
INTRAVENOUS | Status: DC
  Administered 2011-06-30: 09:00:00 via INTRAVENOUS
  Administered 2011-06-30: 50 mL/h via INTRAVENOUS
  Administered 2011-06-30: 10:00:00 via INTRAVENOUS

## 2011-06-30 MED ORDER — FENTANYL CITRATE 0.05 MG/ML IJ SOLN
INTRAMUSCULAR | Status: AC
  Filled 2011-06-30: qty 2

## 2011-06-30 MED ORDER — ATROPINE SULFATE 0.4 MG/ML IJ SOLN
INTRAMUSCULAR | Status: AC
  Filled 2011-06-30: qty 1

## 2011-06-30 MED ORDER — VECURONIUM BROMIDE 10 MG IV SOLR
INTRAVENOUS | Status: AC
  Filled 2011-06-30: qty 10

## 2011-06-30 MED ORDER — DEXTROSE 5 % IV SOLN
1.0000 g | INTRAVENOUS | Status: DC | PRN
  Administered 2011-06-30: 1 g via INTRAVENOUS

## 2011-06-30 SURGICAL SUPPLY — 100 items
ADAPTER GOLDBERG URETERAL (ADAPTER) ×3 IMPLANT
APPLIER CLIP 11 MED OPEN (CLIP)
APPLIER CLIP 13 LRG OPEN (CLIP)
ATTRACTOMAT 16X20 MAGNETIC DRP (DRAPES) ×3 IMPLANT
BAG DRAIN URO TABLE W/ADPT NS (DRAPE) IMPLANT
BAG HAMPER (MISCELLANEOUS) ×6 IMPLANT
BARRIER SKIN 2 3/4 (OSTOMY) IMPLANT
BASKET GEMINI (MISCELLANEOUS) IMPLANT
BASKET/GRASPER STONE 3FR 13CM (MISCELLANEOUS) IMPLANT
CATH FOLEY 2WAY SLVR  5CC 16FR (CATHETERS) ×1
CATH FOLEY 2WAY SLVR 5CC 16FR (CATHETERS) ×2 IMPLANT
CATH URET WHISTLE 6FR (CATHETERS) ×6 IMPLANT
CATH WEDGE TIP 5FR (CATHETERS) ×3 IMPLANT
CELLS DAT CNTRL 66122 CELL SVR (MISCELLANEOUS) IMPLANT
CLAMP POUCH DRAINAGE QUIET (OSTOMY) IMPLANT
CLEANER TIP ELECTROSURG 2X2 (MISCELLANEOUS) ×3 IMPLANT
CLIP APPLIE 11 MED OPEN (CLIP) IMPLANT
CLIP APPLIE 13 LRG OPEN (CLIP) IMPLANT
CLOTH BEACON ORANGE TIMEOUT ST (SAFETY) ×6 IMPLANT
COVER LIGHT HANDLE STERIS (MISCELLANEOUS) ×6 IMPLANT
COVER MAYO STAND XLG (DRAPE) IMPLANT
DECANTER SPIKE VIAL GLASS SM (MISCELLANEOUS) ×3 IMPLANT
DILATOR BALLN URETERAL SET (BALLOONS) IMPLANT
DRAPE PROXIMA HALF (DRAPES) ×3 IMPLANT
DRAPE WARM FLUID 44X44 (DRAPE) ×3 IMPLANT
DRSG TEGADERM 4X10 (GAUZE/BANDAGES/DRESSINGS) ×3 IMPLANT
DRSG TEGADERM 4X4.75 (GAUZE/BANDAGES/DRESSINGS) ×3 IMPLANT
ELECT BLADE 6 FLAT ULTRCLN (ELECTRODE) ×3 IMPLANT
ELECT REM PT RETURN 9FT ADLT (ELECTROSURGICAL) ×3
ELECTRODE REM PT RTRN 9FT ADLT (ELECTROSURGICAL) ×2 IMPLANT
FLOOR PAD 36X40 (MISCELLANEOUS)
GLOVE BIOGEL PI IND STRL 7.0 (GLOVE) ×6 IMPLANT
GLOVE BIOGEL PI IND STRL 8 (GLOVE) ×2 IMPLANT
GLOVE BIOGEL PI INDICATOR 7.0 (GLOVE) ×3
GLOVE BIOGEL PI INDICATOR 8 (GLOVE) ×1
GLOVE ECLIPSE 6.5 STRL STRAW (GLOVE) ×12 IMPLANT
GLOVE ECLIPSE 7.0 STRL STRAW (GLOVE) ×9 IMPLANT
GLOVE ECLIPSE 8.0 STRL XLNG CF (GLOVE) ×3 IMPLANT
GLOVE SKINSENSE NS SZ7.0 (GLOVE) ×2
GLOVE SKINSENSE STRL SZ7.0 (GLOVE) ×4 IMPLANT
GOWN BRE IMP SLV AUR XL STRL (GOWN DISPOSABLE) ×9 IMPLANT
GOWN STRL REIN XL XLG (GOWN DISPOSABLE) ×6 IMPLANT
INST SET MAJOR GENERAL (KITS) ×3 IMPLANT
IV NS IRRIG 3000ML ARTHROMATIC (IV SOLUTION) ×6 IMPLANT
KIT ROOM TURNOVER AP CYSTO (KITS) ×3 IMPLANT
KIT ROOM TURNOVER APOR (KITS) ×3 IMPLANT
LIGASURE IMPACT 36 18CM CVD LR (INSTRUMENTS) ×3 IMPLANT
MANIFOLD NEPTUNE II (INSTRUMENTS) ×3 IMPLANT
NS IRRIG 1000ML POUR BTL (IV SOLUTION) ×9 IMPLANT
PACK ABDOMINAL MAJOR (CUSTOM PROCEDURE TRAY) ×3 IMPLANT
PACK CYSTO (CUSTOM PROCEDURE TRAY) ×3 IMPLANT
PAD ARMBOARD 7.5X6 YLW CONV (MISCELLANEOUS) ×3 IMPLANT
PAD FLOOR 36X40 (MISCELLANEOUS) IMPLANT
POUCH OSTOMY 2 3/4  H 3804 (WOUND CARE)
POUCH OSTOMY 2 PC DRNBL 2.75 (WOUND CARE) IMPLANT
RELOAD LINEAR CUT PROX 55 BLUE (ENDOMECHANICALS) IMPLANT
RELOAD PROXIMATE 75MM BLUE (ENDOMECHANICALS) IMPLANT
RETAINER VISCERA MED (MISCELLANEOUS) IMPLANT
RETRACTOR WND ALEXIS 25 LRG (MISCELLANEOUS) ×2 IMPLANT
RETRACTOR WOUND ALXS 34CM XLRG (MISCELLANEOUS) IMPLANT
RTRCTR WOUND ALEXIS 18CM MED (MISCELLANEOUS)
RTRCTR WOUND ALEXIS 25CM LRG (MISCELLANEOUS) ×3
RTRCTR WOUND ALEXIS 34CM XLRG (MISCELLANEOUS)
SET BASIN LINEN APH (SET/KITS/TRAYS/PACK) ×3 IMPLANT
SHEET LAVH (DRAPES) ×3 IMPLANT
SOL PREP PROV IODINE SCRUB 4OZ (MISCELLANEOUS) ×3 IMPLANT
SPONGE GAUZE 4X4 12PLY (GAUZE/BANDAGES/DRESSINGS) IMPLANT
SPONGE INTESTINAL PEANUT (DISPOSABLE) ×3 IMPLANT
SPONGE LAP 18X18 X RAY DECT (DISPOSABLE) ×3 IMPLANT
STAPLER AUT SUT LDS 15W (STAPLE) IMPLANT
STAPLER GUN LINEAR PROX 60 (STAPLE) IMPLANT
STAPLER PROXIMATE 55 BLUE (STAPLE) IMPLANT
STAPLER PROXIMATE 75MM BLUE (STAPLE) IMPLANT
STAPLER VISISTAT 35W (STAPLE) ×6 IMPLANT
STENT URET 6FRX26 CONTOUR (STENTS) IMPLANT
SUCTION POOLE TIP (SUCTIONS) ×3 IMPLANT
SUT CHROMIC 4 0 PS 2 18 (SUTURE) IMPLANT
SUT NOVA NAB GS-26 0 60 (SUTURE) IMPLANT
SUT PDS AB 3-0 SH 27 (SUTURE) IMPLANT
SUT PROLENE 0 CT 1 CR/8 (SUTURE) IMPLANT
SUT PROLENE 1 CT 1 30 (SUTURE) IMPLANT
SUT SILK 0 FSL (SUTURE) ×3 IMPLANT
SUT SILK 2 0 (SUTURE) ×1
SUT SILK 2-0 18XBRD TIE 12 (SUTURE) ×2 IMPLANT
SUT SILK 3 0 SH CR/8 (SUTURE) ×3 IMPLANT
SUT VIC AB 0 CT1 27 (SUTURE) ×5
SUT VIC AB 0 CT1 27XBRD ANTBC (SUTURE) ×4 IMPLANT
SUT VIC AB 0 CT1 27XCR 8 STRN (SUTURE) ×6 IMPLANT
SUT VIC AB 3-0 SH 27 (SUTURE)
SUT VIC AB 3-0 SH 27X BRD (SUTURE) IMPLANT
SUT VICRYL AB 2 0 TIES (SUTURE) ×3 IMPLANT
SUT VICRYL AB 3 0 TIES (SUTURE) IMPLANT
SYR BULB IRRIGATION 50ML (SYRINGE) ×3 IMPLANT
SYR CONTROL 10ML LL (SYRINGE) ×3 IMPLANT
SYRINGE 10CC LL (SYRINGE) ×3 IMPLANT
SYRINGE IRR TOOMEY STRL 70CC (SYRINGE) ×3 IMPLANT
TOWEL OR 17X26 4PK STRL BLUE (TOWEL DISPOSABLE) ×6 IMPLANT
TRAY FOLEY CATH 14FR (SET/KITS/TRAYS/PACK) IMPLANT
WATER STERILE IRR 1000ML POUR (IV SOLUTION) ×6 IMPLANT
WIRE GUIDE BENTSON .035 15CM (WIRE) ×3 IMPLANT

## 2011-06-30 NOTE — Progress Notes (Signed)
POST OP CHECK  Filed Vitals:   06/30/11 1351  BP: 125/81  Pulse: 58  Temp: 97.8 F (36.6 C)  Resp: 17   Awake and alert.  Considerable incisional pain.  Wound clean with min serous spotting. Pt still has a little bloody urine output, expected S/P ureteral stents.  Abdom. Soft, min NG output.  C/O post anesthesia nausea, have added phenergan sup.  Will need to ambulate.

## 2011-06-30 NOTE — Brief Op Note (Signed)
06/30/2011  10:42 AM  PATIENT:  [name]  55 y.o. female  PRE-OPERATIVE DIAGNOSIS:  Pelvic Mass  POST-OPERATIVE DIAGNOSIS:  Pelvic Mass  PROCEDURE:  Procedure(s): EXPLORATORY LAPAROTOMY CYSTOSCOPY WITH STENT REPLACEMENT  SURGEON:  Surgeon(s): Earle Gell, MD Dennie Maizes  PHYSICIAN ASSISTANT:   ASSISTANTS:Drs. Despina Hidden and Royal Pines  ANESTHESIA:   general  OR FLUID I/O:  Total I/O In: 2200 [I.V.:2200] Out: 150 [Urine:100; Blood:50]  BLOOD ADMINISTERED:none  DRAINS: none   LOCAL MEDICATIONS USED:  NONE  SPECIMEN:  Source of Specimen:  bladder inclusion cyst  DISPOSITION OF SPECIMEN:  PATHOLOGY  COUNTS:  YES  TOURNIQUET:  * No tourniquets in log *  DICTATION: .Other Dictation: Dictation Number [id number]  PLAN OF CARE: Admit to inpatient   PATIENT DISPOSITION:  PACU - hemodynamically stable.   Delay start of Pharmacological VTE agent (>24hrs) due to surgical blood loss or risk of bleeding:  not applicable     OP note dict. # [id number]

## 2011-06-30 NOTE — Op Note (Signed)
NAMEMILANNI, AYUB                 ACCOUNT NO.:  [id number]  MEDICAL RECORD NO.:  [id number]  LOCATION:  [facility]                          FACILITY:  [facility]  PHYSICIAN:  Dennie Maizes, M.D.   DATE OF BIRTH:  1955-11-03  DATE OF PROCEDURE:  06/30/2011 DATE OF DISCHARGE:                              OPERATIVE REPORT   PREOPERATIVE DIAGNOSIS:  Ovarian mass.  POSTOPERATIVE DIAGNOSIS:  Ovarian mass, urethral stenosis.  OPERATIVE PROCEDURE:  Cystoscopy, urethral dilation, bilateral ureteral stent, bilateral ureteral catheter placement.  ANESTHESIA:  General.  SURGEON:  Dennie Maizes, MD  COMPLICATIONS:  None.  DRAINS:  A 6-French ureteral catheter x2, 16-French Foley catheter in the bladder.  ESTIMATED BLOOD LOSS:  Minimal.  SPECIMEN:  None.  COMPLICATIONS:  None.  INDICATIONS FOR PROCEDURE:  A 55 year old female had chronic pelvic pain.  Evaluation revealed irregular pelvic mass.  Dr. Malvin Johns planned to do exploratory laparotomy as well as excision of pelvic mass.  He asked me to do cystoscopy and place bilateral ureteral catheters for intraoperative localization of the ureters.  DESCRIPTION OF PROCEDURE:  General anesthesia was induced and the patient was placed on the OR table in the dorsal lithotomy position. The lower abdomen and genitalia were prepped and draped in a sterile fashion.  Examination revealed urethral stenosis.  Dilation of urethra was done up to 24-French with metal sounds.  A 20-French cystoscope was then introduced into the bladder.  Bladder mucosa was mildly trabeculated.  No other abnormality was noted inside the bladder.  The trigone ureteral orifice and bladder mucosa was otherwise normal.  A 6- [place] whistle-tip ureteral catheter was then placed on the left side up to level of 20 cm.  A second catheter was placed on the right side without any difficulty.  The cystoscope was removed.  A 16-French Foley catheter was inserted in the bladder.   The ureteral catheter was affixed to the Foley catheter using silk tie and tape.  The catheters were then connected to collecting device.  Dr. Malvin Johns then proceeded with exploratory laparotomy.     Dennie Maizes, M.D.     SK/MEDQ  D:  06/30/2011  T:  06/30/2011  Job:  [id number]  cc:   Barbaraann Barthel, M.D. Fax: [phone]

## 2011-06-30 NOTE — Op Note (Signed)
[name], [name]                 ACCOUNT NO.:  [id number]  MEDICAL RECORD NO.:  [id number]  LOCATION:  [facility]                          FACILITY:  [facility]  PHYSICIAN:  Barbaraann Barthel, M.D. DATE OF BIRTH:  Aug 13, 1956  DATE OF PROCEDURE: DATE OF DISCHARGE:                              OPERATIVE REPORT   PROCEDURE:  Exploratory laparotomy with excision of inclusion cyst on dome of bladder.  SURGEON:  Barbaraann Barthel, MD.  ASSISTANT:  Dr. Despina Hidden and Dr. Rito Ehrlich.  SPECIMEN:  Pelvic mass.  Note, this is a 55 year old white female who had approximately 3-week history of abdominal pain.  This was unrelated to appetite or to her bowel movements.  She had no fever and she had persistent feeling of fullness and lower abdominal pain.  On pelvic examination and rectal examination, she had a firm mass that was easily palpable.  This CT scan showed a complex cyst in this area, which did not appeared to be an abscess in fact, she had no history of fever, chills, or an elevated white count and the possibility of an ovarian cyst was raised or possibly an inclusion cyst as well.  Of note is the fact that the patient in 1982 underwent a TAH-BSO, however, when trying to obtain those records from [place].  Those records were not available to [place] despite multiple phone calls and I have learned subsequently that there was some litigation involved and those records were frozen, at any rate they were not available to [place] after multiple telephone calls because the patient could not be certain that she had a BSO so the possibility of retained ovary was entertained.  At any rate, we obtained permission for surgery, discussing complications not limited to but including bleeding, infection, and the possibility of further surgery might be required.  I also addressed the need for stent placement as we did not know how complex the mass this was down there.  Also in possibility that this might be an ovarian  pathology.  I also had Dr. Despina Hidden available as an assistant as well.  Informed consent was obtained.  For details regarding the placement of ureteral stents, please see Dr. Chancy Milroy note.  This was done prior to my part of that procedure.  SPECIMEN:  Pelvic mass.  WOUND CLASSIFICATION:  Clean.  TECHNIQUE:  The patient was placed in the lithotomy position and after prepping with Betadine solution and draping in usual manner and after placement of Foley catheter and ureteral stents, the midline incision was carried out in the area of a previous incision.  The skin and subcutaneous tissue was incised down to the fascia which was opened in the midline.  The abdomen was then retracted with the use of a Balfour retractor and a protractor wound liner device.  The abdomen was explored.  There were some adhesions involving the small bowel and omentum in the pelvis.  These were easily lysed, and with the patient Trendelenburg, the small bowel was examined as well as a large bowel and there were no other abnormalities encountered.  There was no ovarian cyst found.  The patient had on the dome of the bladder on the right side a  remnant of a cyst that perforated during the laparotomy or the examination.  This was easily removed in fact was on a small pedicle and spontaneously was removed without any bleeding noted.  A small pedicle was then cauterized.  We checked for hemostasis and then irrigated with normal saline solution and then I closed the peritoneum with a running 0 Polysorb and then closed the fascia with interrupted figure-of-eight Polysorb sutures.  The subcu was irrigated and the skin was approximated with stapling device.  A sterile OpSite dressing was applied.  Prior to closure all sponge, needle, and instrument counts were found to be correct.  Estimated blood loss was minimal.  The patient tolerated the procedure well.  She received 20-100 mL of crystalloids intraoperatively.  No  drains were placed and she had approximately a 50 mL blood loss.     Barbaraann Barthel, M.D.     WB/MEDQ  D:  06/30/2011  T:  06/30/2011  Job:  [id number]  cc:   Madelin Rear. Sherwood Gambler, MD Fax: [phone]  Lazaro Arms, M.D. Fax: [phone]  Dr. Rito Ehrlich

## 2011-06-30 NOTE — Anesthesia Postprocedure Evaluation (Addendum)
  Anesthesia Post-op Note  Patient: [name]  Procedure(s) Performed:  EXPLORATORY LAPAROTOMY - Exploratory Laparotomy, Excision of Pelvic Mass--Procedure started at 0848.; CYSTOSCOPY WITH STENT REPLACEMENT - Urethral Dilation, Cystoscopy with Bilateral Ureteral Catheter Placement started at 0815.  Ended at [phone].  Patient Location: PACU  Anesthesia Type: General  Level of Consciousness: awake, alert , oriented and patient cooperative  Airway and Oxygen Therapy: Patient Spontanous Breathing  Post-op Pain: mild  Post-op Assessment: Post-op Vital signs reviewed, Patient's Cardiovascular Status Stable, Respiratory Function Stable, Patent Airway, No signs of Nausea or vomiting and Pain level controlled  Post-op Vital Signs: Reviewed and stable  Complications: No apparent anesthesia complications  07/02/2011  Patient doing well.  VSS.  No apparent anesthesia complications.

## 2011-06-30 NOTE — Progress Notes (Signed)
H&P dict. # [id number]  Filed Vitals:   06/30/11 7829  Temp: 98.1 F (36.7 C)    Pt for lap for pelvic mass.  Have coordinated care with [facility]ne and Uro services. Procedure and risks explained and all questions answered.   Pt. Received mech.  prep as OP.  Labs reviewed.  For surgery via OP dept. With plans for post op admission.  No significant change clinically since out pt H&P.

## 2011-06-30 NOTE — Transfer of Care (Signed)
Immediate Anesthesia Transfer of Care Note  Patient: [name]  Procedure(s) Performed:  EXPLORATORY LAPAROTOMY - Exploratory Laparotomy, Excision of Pelvic Mass--Procedure started at 0848.; CYSTOSCOPY WITH STENT REPLACEMENT - Urethral Dilation, Cystoscopy with Bilateral Ureteral Catheter Placement started at 0815.  Ended at [phone].  Patient Location: PACU  Anesthesia Type: General  Level of Consciousness: awake and oriented  Airway & Oxygen Therapy: Patient Spontanous Breathing and Patient connected to face mask oxygen  Post-op Assessment: Report given to PACU RN, Post -op Vital signs reviewed and stable and Patient moving all extremities  Post vital signs: Reviewed and stable  Complications: No apparent anesthesia complications

## 2011-06-30 NOTE — Anesthesia Procedure Notes (Addendum)
Procedure Name: Intubation Date/Time: 06/30/2011 7:54 AM Performed by: Marylene Buerger Pre-anesthesia Checklist: Emergency Drugs available, Patient identified and Patient being monitored Patient Re-evaluated:Patient Re-evaluated prior to inductionOxygen Delivery Method: Circle System Utilized Preoxygenation: Pre-oxygenation with 100% oxygen   Date/Time: 06/30/2011 8:02 AM Performed by: Marylene Buerger Intubation Type: IV induction and Circoid Pressure applied Ventilation: Mask ventilation without difficulty Laryngoscope Size: Mac and 3 Grade View: Grade I Tube type: Oral Tube size: 7.0 mm Number of attempts: 1 Placement Confirmation: breath sounds checked- equal and bilateral,  positive ETCO2 and ETT inserted through vocal cords under direct vision Secured at: 22 cm Tube secured with: Tape Dental Injury: Teeth and Oropharynx as per pre-operative assessment

## 2011-06-30 NOTE — Anesthesia Preprocedure Evaluation (Addendum)
Anesthesia Evaluation  Name, MR# and DOB Patient awake  General Assessment Comment  Reviewed: Allergy & Precautions, H&P , NPO status , Patient's Chart, lab work & pertinent test results, reviewed documented beta blocker date and time   Airway Mallampati: II TM Distance: >3 FB Neck ROM: Full    Dental No notable dental hx.    Pulmonary    pulmonary exam normalPulmonary Exam Normal     Cardiovascular hypertension, Pt. on medications and Pt. on home beta blockers Regular Normal    Neuro/Psych    (+) Anxiety, Depression,   Neuromuscular disease   GI/Hepatic/Renal   Endo/Other    Abdominal Normal abdominal exam  (+)   Musculoskeletal  (+) Fibromyalgia -  Hematology   Peds  Reproductive/Obstetrics    Anesthesia Other Findings            Anesthesia Physical Anesthesia Plan  ASA: II  Anesthesia Plan: General   Post-op Pain Management:    Induction: Intravenous  Airway Management Planned: Oral ETT  Additional Equipment:   Intra-op Plan:   Post-operative Plan: Extubation in OR  Informed Consent: I have reviewed the patients History and Physical, chart, labs and discussed the procedure including the risks, benefits and alternatives for the proposed anesthesia with the patient or authorized representative who has indicated his/her understanding and acceptance.     Plan Discussed with: CRNA  Anesthesia Plan Comments:         Anesthesia Quick Evaluation

## 2011-07-01 LAB — COMPREHENSIVE METABOLIC PANEL
Albumin: 2.8 g/dL — ABNORMAL LOW (ref 3.5–5.2)
Alkaline Phosphatase: 57 U/L (ref 39–117)
BUN: 7 mg/dL (ref 6–23)
CO2: 27 mEq/L (ref 19–32)
Chloride: 106 mEq/L (ref 96–112)
Creatinine, Ser: 0.5 mg/dL (ref 0.50–1.10)
GFR calc Af Amer: >60 mL/min (ref >60)
GFR calc non Af Amer: >60 mL/min (ref >60)
Glucose, Bld: 113 mg/dL — ABNORMAL HIGH (ref 70–99)
Total Bilirubin: 0.6 mg/dL (ref 0.3–1.2)

## 2011-07-01 LAB — CBC
Platelets: 196 10*3/uL (ref 150–400)
RBC: 3.75 MIL/uL — ABNORMAL LOW (ref 3.87–5.11)
RDW: 13.6 % (ref 11.5–15.5)
WBC: 12.1 10*3/uL — ABNORMAL HIGH (ref 4.0–10.5)

## 2011-07-01 MED ORDER — MORPHINE SULFATE (PF) 1 MG/ML IV SOLN
INTRAVENOUS | Status: DC
  Administered 2011-07-01 – 2011-07-02 (×3): 25 mg via INTRAVENOUS
  Filled 2011-07-01 (×3): qty 25

## 2011-07-01 MED ORDER — SODIUM CHLORIDE 0.9 % IJ SOLN: 9.0000 mL | INTRAMUSCULAR | Status: DC | PRN

## 2011-07-01 MED ORDER — MORPHINE SULFATE (PF) 1 MG/ML IV SOLN: INTRAVENOUS | Status: DC

## 2011-07-01 MED ORDER — NALOXONE HCL 0.4 MG/ML IJ SOLN: 0.4000 mg | INTRAMUSCULAR | Status: DC | PRN

## 2011-07-01 MED ORDER — DIPHENHYDRAMINE HCL 12.5 MG/5ML PO ELIX: 12.5000 mg | ORAL_SOLUTION | Freq: Four times a day (QID) | ORAL | Status: DC | PRN

## 2011-07-01 MED ORDER — ONDANSETRON HCL 4 MG/2ML IJ SOLN
4.0000 mg | Freq: Four times a day (QID) | INTRAMUSCULAR | Status: DC | PRN
  Administered 2011-07-02: 4 mg via INTRAVENOUS

## 2011-07-01 MED ORDER — DIPHENHYDRAMINE HCL 50 MG/ML IJ SOLN: 12.5000 mg | Freq: Four times a day (QID) | INTRAMUSCULAR | Status: DC | PRN

## 2011-07-01 MED ORDER — DOCUSATE SODIUM 100 MG PO CAPS
100.0000 mg | ORAL_CAPSULE | Freq: Two times a day (BID) | ORAL | Status: DC
  Administered 2011-07-01 – 2011-07-03 (×4): 100 mg via ORAL
  Filled 2011-07-01 (×4): qty 1

## 2011-07-01 MED ORDER — ONDANSETRON HCL 4 MG/2ML IJ SOLN: 4.0000 mg | Freq: Four times a day (QID) | INTRAMUSCULAR | Status: DC | PRN

## 2011-07-01 MED ORDER — POTASSIUM CHLORIDE 10 MEQ/100ML IV SOLN
10.0000 meq | INTRAVENOUS | Status: AC
  Administered 2011-07-01 (×3): 10 meq via INTRAVENOUS
  Filled 2011-07-01: qty 300

## 2011-07-01 NOTE — Progress Notes (Addendum)
Pt up to chair this afternoon.  Ambulated and getting up to bathroom with assist.  Tolerated well.

## 2011-07-01 NOTE — Progress Notes (Signed)
1 Day Post-Op  Subjective: Afebrile abdom less tender.  I& 0's incorrect, total ~400 cc.  BS present. No flatus.  Wound clean and redressed.  Urine much less bloody, orange in color. Will D/C foley and NG and ambulate.   Labs [k+] 3.3 will replace.  H/H 11.5/34  Doing well post op.  Objective: Vital signs in last 24 hours: Temp:  [97.8 F (36.6 C)-99.3 F (37.4 C)] 99.2 F (37.3 C) (09/15 0515) Pulse Rate:  [51-74] 65  (09/15 0904) Resp:  [11-20] 16  (09/15 0515) BP: (103-137)/(59-81) 117/71 mmHg (09/15 0904) SpO2:  [91 %-100 %] 99 % (09/15 0819) Weight:  [87.998 kg (194 lb)] 194 lb (87.998 kg) (09/14 1232) Last BM Date: 06/28/11  Intake/Output from previous day: 09/14 0701 - 09/15 0700 In: 4799 [I.V.:2200; NG/GT:2599] Out: 1875 [Urine:1625; Emesis/NG output:200; Blood:50] Intake/Output this shift:    BMET  Basename 07/01/11 0503  NA 139  K 3.3*  CL 106  CO2 27  GLUCOSE 113*  BUN 7  CREATININE 0.50  CALCIUM 8.1*   PT/INR No results found for this basename: LABPROT:2,INR:2 in the last 72 hours ABG No results found for this basename: PHART:2,PCO2:2,PO2:2,HCO3:2 in the last 72 hours  Studies/Results: No results found.  Anti-infectives: Anti-infectives     Start     Dose/Rate Route Frequency Ordered Stop   06/30/11 0715   cefTRIAXone (ROCEPHIN) 1 g in dextrose 5 % 50 mL IVPB  Status:  Discontinued        1 g 100 mL/hr over 30 Minutes Intravenous  Once 06/30/11 0703 06/30/11 1217          Assessment/Plan: s/p Procedure(s): EXPLORATORY LAPAROTOMY CYSTOSCOPY WITH STENT REPLACEMENT   LOS: 1 day    Talin Rozeboom S 07/01/2011

## 2011-07-02 LAB — BASIC METABOLIC PANEL
Calcium: 8.4 mg/dL (ref 8.4–10.5)
Creatinine, Ser: <0.47 mg/dL — ABNORMAL LOW (ref 0.50–1.10)
Glucose, Bld: 118 mg/dL — ABNORMAL HIGH (ref 70–99)
Sodium: 138 mEq/L (ref 135–145)

## 2011-07-02 LAB — CBC
Hemoglobin: 10.9 g/dL — ABNORMAL LOW (ref 12.0–15.0)
MCH: 31.1 pg (ref 26.0–34.0)
MCHC: 32.8 g/dL (ref 30.0–36.0)
MCV: 94.6 fL (ref 78.0–100.0)

## 2011-07-02 MED ORDER — ENOXAPARIN SODIUM 40 MG/0.4ML ~~LOC~~ SOLN
40.0000 mg | SUBCUTANEOUS | Status: DC
  Administered 2011-07-03: 40 mg via SUBCUTANEOUS
  Filled 2011-07-02 (×2): qty 0.4

## 2011-07-02 MED ORDER — MORPHINE SULFATE 2 MG/ML IJ SOLN: 1.0000 mg | INTRAMUSCULAR | Status: DC | PRN

## 2011-07-02 MED ORDER — OXYCODONE-ACETAMINOPHEN 5-325 MG PO TABS
1.0000 | ORAL_TABLET | Freq: Four times a day (QID) | ORAL | Status: DC | PRN
  Administered 2011-07-02: 1 via ORAL
  Administered 2011-07-02 – 2011-07-03 (×2): 2 via ORAL
  Administered 2011-07-03: 1 via ORAL
  Filled 2011-07-02 (×4): qty 2

## 2011-07-02 MED ORDER — ENSURE CLINICAL ST REVIGOR PO LIQD
237.0000 mL | Freq: Three times a day (TID) | ORAL | Status: DC
  Administered 2011-07-02 – 2011-07-03 (×3): 237 mL via ORAL

## 2011-07-02 MED ORDER — ENOXAPARIN SODIUM 30 MG/0.3ML ~~LOC~~ SOLN: 30.0000 mg | SUBCUTANEOUS | Status: DC

## 2011-07-02 NOTE — Progress Notes (Signed)
07/02/11 1830 Nurse notified by patient family this evening that patient noted to have reddened areas to sacrum/buttocks when up to bedside commode. Stated concerned about further issues and possible breakdown and requested skin protectant cream. Cream given as requested. Skin assessment performed and verified by 3 RNs, A Percell Boston, RN and J Mays,RN. Reddened, blanchable areas noted to sacrum and buttocks, skin protectant cream applied, pillows placed to reduce pressure. Patient stated was comfortable, nursing staff to continue to monitor.

## 2011-07-02 NOTE — Progress Notes (Signed)
Pt ambulated in hallways with family; While ambulating oxygen saturations dropped to 74%; pt back in bed, replaced O2 sats back up high 80s to low 90s; pt encouraged to ambulate and cough/deep breath and use incentive spirometry. MD aware

## 2011-07-02 NOTE — Progress Notes (Signed)
Encounter addended by: Glynn Octave on: 07/02/2011  5:27 PM<BR>     Documentation filed: Notes Section

## 2011-07-02 NOTE — Progress Notes (Signed)
POD # 2  abdom soft.  Wound clean and redressed. Bowel sounds audible and pt passing flatus.  Has nausea but not distended pt is very dependant on PCA pump.  Unfortunately she has chronic back pain as well as recent surgery.  She dropped her O2 sats but I think related to sedation; she has no chest pain or shortness of breath.  O2 sat now 93% on 2L/min.  Pt is a smoker but not using patch.  Labs reviewed  Results for orders placed during the hospital encounter of 06/30/11 (from the past 24 hour(s))  CBC     Status: Abnormal   Collection Time   07/02/11  5:06 AM      Component Value Range   WBC 12.0 (*) 4.0 - 10.5 (K/uL)   RBC 3.51 (*) 3.87 - 5.11 (MIL/uL)   Hemoglobin 10.9 (*) 12.0 - 15.0 (g/dL)   HCT 40.9 (*) 81.1 - 46.0 (%)   MCV 94.6  78.0 - 100.0 (fL)   MCH 31.1  26.0 - 34.0 (pg)   MCHC 32.8  30.0 - 36.0 (g/dL)   RDW 91.4  78.2 - 95.6 (%)   Platelets 166  150 - 400 (K/uL)  BASIC METABOLIC PANEL     Status: Abnormal   Collection Time   07/02/11  5:06 AM      Component Value Range   Sodium 138  135 - 145 (mEq/L)   Potassium 3.4 (*) 3.5 - 5.1 (mEq/L)   Chloride 105  96 - 112 (mEq/L)   CO2 27  19 - 32 (mEq/L)   Glucose, Bld 118 (*) 70 - 99 (mg/dL)   BUN 8  6 - 23 (mg/dL)   Creatinine, Ser <2.13 (*) 0.50 - 1.10 (mg/dL)   Calcium 8.4  8.4 - 08.6 (mg/dL)   GFR calc non Af Amer NOT CALCULATED  >60 (mL/min)   GFR calc Af Amer NOT CALCULATED  >60 (mL/min)    urine almost clear will D/C foley and encourage ambulation.

## 2011-07-03 NOTE — Discharge Summary (Signed)
  D/C Dict. #  [id number]

## 2011-07-03 NOTE — Progress Notes (Signed)
POD#3  Abdom. very soft had 2 small BMs.  Wound clean and redressed.  Pt tolerating PO well.  No leg pain or shortness of breath Filed Vitals:   07/03/11 1400  BP: 160/80  Pulse: 71  Temp: 99.3 F (37.4 C)  Resp: 18   O2 sat 98%.  D/C and F/U arranged.Marland Kitchen

## 2011-07-03 NOTE — Progress Notes (Signed)
Discharge Summary: a/o.vss. Dressing to abdomen changed clean dry intact. Saline lock removed. No complaints of distress. Discharge instructions given. Pt verbalized understanding of instructions. Left floor via wheelchair with nursing staff and family members.

## 2011-07-03 NOTE — Discharge Summary (Signed)
NAMESAWSAN, RIGGIO                 ACCOUNT NO.:  [id number]  MEDICAL RECORD NO.:  [id number]  LOCATION:  A324                          FACILITY:  [facility]  PHYSICIAN:  Barbaraann Barthel, M.D. DATE OF BIRTH:  06-Nov-1955  DATE OF ADMISSION:  06/30/2011 DATE OF DISCHARGE:  09/17/2012LH                              DISCHARGE SUMMARY   DIAGNOSIS:  Pelvic mass (pathology pending).  PROCEDURE:  Exploratory laparotomy with excision of pelvic mass on June 30, 2011.  CONSULTATIONS: 1. Dennie Maizes, MD for Urology. 2. Lazaro Arms, MD, OB/GYN Service.  NOTE:  This is a 55 year old white female who had a palpable mass on pelvic examination and rectal examination.  She had a CT scan which revealed a complex cystic structure.  There was a question whether or not this was ovarian or an inclusion cyst.  She had had a previous TAH- BSO for endometriosis in 1982.  Unfortunately, those records were not available to [place] and she was not certain whether she had had in fact a BSO.  At any rate, we made plans for excision of this, this took place uneventfully on June 30, 2011.  Preoperatively, I did enlist the help of the urologist for renal stent placements and because there might hav been an issue with ovarian pathology, I did invite Dr. Despina Hidden as an assistant.  Surgery went well without any problems.  Postoperatively, she had a low tolerance for pain and she required a PCA pump and she takes narcotics for chronic back pain and it was difficult to control her pain.  She also did have a hypotensive episode and low saturation rate when the problems with the sedation however, this improved.  We did have her on Lovenox and she did not have any shortness of breath or chest pain during this and this improved when we were able to manipulate her analgesic meds.  At any rate, she did well postoperatively and was discharged on the third postoperative day.  We removed the Foley on the  second postoperative day and there was some bleeding noted expected after ureteral placements, however, at that time, we removed the Foley.  Her urine had almost resumed into natural amber color.  At the time of discharge, she was tolerating p.o. well.  She did have two small bowel movements.  Her abdomen was completely soft and her wound was clean without any signs of infection.  We gave her explicit discharge instructions and she is to call us or go to the emergency room, should there be any acute changes.  We will follow her perioperatively.     Barbaraann Barthel, M.D.     WB/MEDQ  D:  07/03/2011  T:  07/03/2011  Job:  [id number]  cc:   Madelin Rear. Sherwood Gambler, MD Fax: [phone]  Lazaro Arms, M.D. Fax: [phone]  Dennie Maizes, M.D. Fax: [phone]

## 2011-07-05 ENCOUNTER — Encounter (HOSPITAL_COMMUNITY): Payer: Self-pay | Admitting: General Surgery

## 2011-10-26 ENCOUNTER — Other Ambulatory Visit: Payer: Self-pay | Admitting: Neurosurgery

## 2011-10-26 DIAGNOSIS — M47812 Spondylosis without myelopathy or radiculopathy, cervical region: Secondary | ICD-10-CM

## 2012-02-05 ENCOUNTER — Other Ambulatory Visit: Payer: Self-pay | Admitting: Neurosurgery

## 2012-02-05 DIAGNOSIS — M47812 Spondylosis without myelopathy or radiculopathy, cervical region: Secondary | ICD-10-CM

## 2012-02-09 ENCOUNTER — Other Ambulatory Visit: Payer: Self-pay | Admitting: Neurosurgery

## 2012-02-09 DIAGNOSIS — M47816 Spondylosis without myelopathy or radiculopathy, lumbar region: Secondary | ICD-10-CM

## 2012-02-19 ENCOUNTER — Ambulatory Visit
Admission: RE | Admit: 2012-02-19 | Discharge: 2012-02-19 | Disposition: A | Discharge status: Home / Self Care | Payer: Medicare Other | Source: Ambulatory Visit | Attending: Neurosurgery | Admitting: Neurosurgery

## 2012-02-19 ENCOUNTER — Other Ambulatory Visit: Payer: Medicare Other

## 2012-02-19 VITALS — BP 135/77 | HR 69

## 2012-02-19 DIAGNOSIS — M47816 Spondylosis without myelopathy or radiculopathy, lumbar region: Secondary | ICD-10-CM

## 2012-02-19 MED ORDER — METHYLPREDNISOLONE ACETATE 40 MG/ML INJ SUSP (RADIOLOG
120.0000 mg | Freq: Once | INTRAMUSCULAR | Status: AC
  Administered 2012-02-19: 120 mg via EPIDURAL

## 2012-02-19 MED ORDER — IOHEXOL 180 MG/ML  SOLN
1.0000 mL | Freq: Once | INTRAMUSCULAR | Status: AC | PRN
  Administered 2012-02-19: 1 mL via EPIDURAL

## 2012-02-19 NOTE — Discharge Instructions (Signed)

## 2014-12-21 ENCOUNTER — Telehealth: Payer: Self-pay | Admitting: Orthopedic Surgery

## 2014-12-21 NOTE — Telephone Encounter (Addendum)
Patient called to inquire as to whether she can return here for "regular" steroid injections in both knees.  She was last seen 08/04/2009.  [place] that time[place] why she's not returned here.  States has had epidural steroid injections in her back, and said she has had both "regular steroid" and also Synvisc injections in both knees while under Dr Harrison's care in 2010.  Please advise.  Patient ph# is [phone] Marland Kitchen(*Also, patient has had a last name change (marriage), which we will need to update if we schedule.  Is it possible for her to return here for "regular" steroid injections in her knees.  Her insurance is traditional Medicare, which I have relayed, does NOT cover Synvisc or Orthovisc.  Please advise.

## 2014-12-30 NOTE — Telephone Encounter (Signed)
no

## 2014-12-31 NOTE — Telephone Encounter (Signed)
Relayed to patient per Dr Mort SawyersHarrison's response.

## 2015-03-22 ENCOUNTER — Inpatient Hospital Stay: Admission: RE | Admit: 2015-03-22 | Payer: Self-pay | Source: Ambulatory Visit

## 2015-03-25 ENCOUNTER — Encounter: Payer: Self-pay | Admitting: *Deleted

## 2015-03-25 DIAGNOSIS — Z9049 Acquired absence of other specified parts of digestive tract: Secondary | ICD-10-CM | POA: Diagnosis not present

## 2015-03-25 DIAGNOSIS — F329 Major depressive disorder, single episode, unspecified: Secondary | ICD-10-CM | POA: Diagnosis not present

## 2015-03-25 DIAGNOSIS — H2511 Age-related nuclear cataract, right eye: Secondary | ICD-10-CM | POA: Diagnosis present

## 2015-03-25 DIAGNOSIS — M797 Fibromyalgia: Secondary | ICD-10-CM | POA: Diagnosis not present

## 2015-03-25 DIAGNOSIS — G2581 Restless legs syndrome: Secondary | ICD-10-CM | POA: Diagnosis not present

## 2015-03-25 DIAGNOSIS — F419 Anxiety disorder, unspecified: Secondary | ICD-10-CM | POA: Diagnosis not present

## 2015-03-25 DIAGNOSIS — F17200 Nicotine dependence, unspecified, uncomplicated: Secondary | ICD-10-CM | POA: Diagnosis not present

## 2015-03-25 DIAGNOSIS — G8929 Other chronic pain: Secondary | ICD-10-CM | POA: Diagnosis not present

## 2015-03-25 DIAGNOSIS — M1990 Unspecified osteoarthritis, unspecified site: Secondary | ICD-10-CM | POA: Diagnosis not present

## 2015-03-25 DIAGNOSIS — Z882 Allergy status to sulfonamides status: Secondary | ICD-10-CM | POA: Diagnosis not present

## 2015-03-25 DIAGNOSIS — M549 Dorsalgia, unspecified: Secondary | ICD-10-CM | POA: Diagnosis not present

## 2015-03-29 ENCOUNTER — Encounter: Payer: Self-pay | Admitting: *Deleted

## 2015-03-29 ENCOUNTER — Encounter
Admission: RE | Disposition: A | Discharge status: Home / Self Care | Payer: Self-pay | Source: Ambulatory Visit | Attending: Ophthalmology

## 2015-03-29 ENCOUNTER — Ambulatory Visit: Payer: Medicare Other | Admitting: *Deleted

## 2015-03-29 ENCOUNTER — Ambulatory Visit
Admission: RE | Admit: 2015-03-29 | Discharge: 2015-03-29 | Disposition: A | Discharge status: Home / Self Care | Payer: Medicare Other | Source: Ambulatory Visit | Attending: Ophthalmology | Admitting: Ophthalmology

## 2015-03-29 DIAGNOSIS — H2511 Age-related nuclear cataract, right eye: Secondary | ICD-10-CM | POA: Diagnosis not present

## 2015-03-29 DIAGNOSIS — F17200 Nicotine dependence, unspecified, uncomplicated: Secondary | ICD-10-CM | POA: Insufficient documentation

## 2015-03-29 DIAGNOSIS — G8929 Other chronic pain: Secondary | ICD-10-CM | POA: Insufficient documentation

## 2015-03-29 DIAGNOSIS — M1990 Unspecified osteoarthritis, unspecified site: Secondary | ICD-10-CM | POA: Insufficient documentation

## 2015-03-29 DIAGNOSIS — M797 Fibromyalgia: Secondary | ICD-10-CM | POA: Insufficient documentation

## 2015-03-29 DIAGNOSIS — F329 Major depressive disorder, single episode, unspecified: Secondary | ICD-10-CM | POA: Insufficient documentation

## 2015-03-29 DIAGNOSIS — Z882 Allergy status to sulfonamides status: Secondary | ICD-10-CM | POA: Insufficient documentation

## 2015-03-29 DIAGNOSIS — Z9049 Acquired absence of other specified parts of digestive tract: Secondary | ICD-10-CM | POA: Insufficient documentation

## 2015-03-29 DIAGNOSIS — G2581 Restless legs syndrome: Secondary | ICD-10-CM | POA: Insufficient documentation

## 2015-03-29 DIAGNOSIS — M549 Dorsalgia, unspecified: Secondary | ICD-10-CM | POA: Insufficient documentation

## 2015-03-29 DIAGNOSIS — F419 Anxiety disorder, unspecified: Secondary | ICD-10-CM | POA: Insufficient documentation

## 2015-03-29 HISTORY — DX: Other intervertebral disc degeneration, lumbar region: M51.36

## 2015-03-29 HISTORY — DX: Reserved for concepts with insufficient information to code with codable children: IMO0002

## 2015-03-29 HISTORY — DX: Other chronic pain: G89.29

## 2015-03-29 HISTORY — DX: Other intervertebral disc degeneration, lumbar region without mention of lumbar back pain or lower extremity pain: M51.369

## 2015-03-29 HISTORY — DX: Unspecified osteoarthritis, unspecified site: M19.90

## 2015-03-29 HISTORY — DX: Restless legs syndrome: G25.81

## 2015-03-29 HISTORY — PX: CATARACT EXTRACTION W/PHACO: SHX586

## 2015-03-29 HISTORY — DX: Dorsalgia, unspecified: M54.9

## 2015-03-29 SURGERY — PHACOEMULSIFICATION, CATARACT, WITH IOL INSERTION
Anesthesia: Monitor Anesthesia Care | Site: Eye | Laterality: Right

## 2015-03-29 MED ORDER — LIDOCAINE HCL (PF) 1 % IJ SOLN
INTRAOCULAR | Status: DC | PRN
  Administered 2015-03-29: .5 mL via OPHTHALMIC

## 2015-03-29 MED ORDER — PHENYLEPHRINE HCL 10 % OP SOLN
OPHTHALMIC | Status: AC
  Administered 2015-03-29: 1 [drp] via OPHTHALMIC
  Filled 2015-03-29: qty 5

## 2015-03-29 MED ORDER — EPINEPHRINE HCL 1 MG/ML IJ SOLN
INTRAMUSCULAR | Status: AC
  Filled 2015-03-29: qty 2

## 2015-03-29 MED ORDER — MOXIFLOXACIN HCL 0.5 % OP SOLN - NO CHARGE
OPHTHALMIC | Status: DC | PRN
  Administered 2015-03-29: 1 [drp] via OPHTHALMIC

## 2015-03-29 MED ORDER — CYCLOPENTOLATE HCL 2 % OP SOLN
1.0000 [drp] | OPHTHALMIC | Status: AC
  Administered 2015-03-29 (×4): 1 [drp] via OPHTHALMIC

## 2015-03-29 MED ORDER — NA CHONDROIT SULF-NA HYALURON 40-17 MG/ML IO SOLN
INTRAOCULAR | Status: AC
  Filled 2015-03-29: qty 1

## 2015-03-29 MED ORDER — TETRACAINE HCL 0.5 % OP SOLN
OPHTHALMIC | Status: AC
  Filled 2015-03-29: qty 2

## 2015-03-29 MED ORDER — SODIUM CHLORIDE 0.9 % IV SOLN
INTRAVENOUS | Status: DC
  Administered 2015-03-29: 500 mL via INTRAVENOUS

## 2015-03-29 MED ORDER — TETRACAINE HCL 0.5 % OP SOLN
OPHTHALMIC | Status: DC | PRN
  Administered 2015-03-29: 1 [drp] via OPHTHALMIC

## 2015-03-29 MED ORDER — CYCLOPENTOLATE HCL 2 % OP SOLN
OPHTHALMIC | Status: AC
  Administered 2015-03-29: 1 [drp] via OPHTHALMIC
  Filled 2015-03-29: qty 2

## 2015-03-29 MED ORDER — HYALURONIDASE HUMAN 150 UNIT/ML IJ SOLN
INTRAMUSCULAR | Status: AC
  Filled 2015-03-29: qty 1

## 2015-03-29 MED ORDER — MOXIFLOXACIN HCL 0.5 % OP SOLN
1.0000 [drp] | OPHTHALMIC | Status: AC
  Administered 2015-03-29 (×3): 1 [drp] via OPHTHALMIC

## 2015-03-29 MED ORDER — BUPIVACAINE HCL (PF) 0.75 % IJ SOLN
INTRAMUSCULAR | Status: DC | PRN
  Administered 2015-03-29: 5 mL

## 2015-03-29 MED ORDER — CARBACHOL 0.01 % IO SOLN
INTRAOCULAR | Status: DC | PRN
  Administered 2015-03-29: 0.5 mL via INTRAOCULAR

## 2015-03-29 MED ORDER — CEFUROXIME OPHTHALMIC INJECTION 1 MG/0.1 ML
INJECTION | OPHTHALMIC | Status: AC
  Filled 2015-03-29: qty 0.1

## 2015-03-29 MED ORDER — PHENYLEPHRINE HCL 10 % OP SOLN
1.0000 [drp] | OPHTHALMIC | Status: AC
  Administered 2015-03-29 (×4): 1 [drp] via OPHTHALMIC

## 2015-03-29 MED ORDER — MOXIFLOXACIN HCL 0.5 % OP SOLN
OPHTHALMIC | Status: AC
  Administered 2015-03-29: 1 [drp] via OPHTHALMIC
  Filled 2015-03-29: qty 3

## 2015-03-29 MED ORDER — CEFUROXIME OPHTHALMIC INJECTION 1 MG/0.1 ML
INJECTION | OPHTHALMIC | Status: DC | PRN
  Administered 2015-03-29: 0.1 mL via INTRACAMERAL

## 2015-03-29 MED ORDER — EPINEPHRINE HCL 1 MG/ML IJ SOLN
INTRAOCULAR | Status: DC | PRN
  Administered 2015-03-29: 200 mL

## 2015-03-29 MED ORDER — BUPIVACAINE HCL (PF) 0.75 % IJ SOLN
INTRAMUSCULAR | Status: AC
  Filled 2015-03-29: qty 10

## 2015-03-29 MED ORDER — LIDOCAINE HCL (PF) 4 % IJ SOLN
INTRAMUSCULAR | Status: AC
  Filled 2015-03-29: qty 10

## 2015-03-29 MED ORDER — ALFENTANIL 500 MCG/ML IJ INJ
INJECTION | INTRAMUSCULAR | Status: DC | PRN
  Administered 2015-03-29: 500 ug via INTRAVENOUS

## 2015-03-29 MED ORDER — MIDAZOLAM HCL 2 MG/2ML IJ SOLN
INTRAMUSCULAR | Status: DC | PRN
Start: 2015-03-29 — End: 2015-03-29
  Administered 2015-03-29: .5 mg via INTRAVENOUS

## 2015-03-29 MED ORDER — NA CHONDROIT SULF-NA HYALURON 40-17 MG/ML IO SOLN
INTRAOCULAR | Status: DC | PRN
  Administered 2015-03-29: 1 mL via INTRAOCULAR

## 2015-03-29 SURGICAL SUPPLY — 26 items
ACTIVE FMS ×3 IMPLANT
CORD BIP STRL DISP 12FT (MISCELLANEOUS) ×3 IMPLANT
DRAPE XRAY CASSETTE 23X24 (DRAPES) ×3 IMPLANT
ERASER HMR WETFIELD 18G (MISCELLANEOUS) ×3 IMPLANT
GLOVE BIO SURGEON STRL SZ8 (GLOVE) ×3 IMPLANT
GLOVE SURG LX 6.5 MICRO (GLOVE) ×2
GLOVE SURG LX 8.0 MICRO (GLOVE) ×2
GLOVE SURG LX STRL 6.5 MICRO (GLOVE) ×1 IMPLANT
GLOVE SURG LX STRL 8.0 MICRO (GLOVE) ×1 IMPLANT
GOWN STRL REUS W/ TWL LRG LVL3 (GOWN DISPOSABLE) ×1 IMPLANT
GOWN STRL REUS W/ TWL XL LVL3 (GOWN DISPOSABLE) ×1 IMPLANT
GOWN STRL REUS W/TWL LRG LVL3 (GOWN DISPOSABLE) ×2
GOWN STRL REUS W/TWL XL LVL3 (GOWN DISPOSABLE) ×2
LENS IOL ACRYSERT 19.5 (Intraocular Lens) ×3 IMPLANT
PACK CATARACT (MISCELLANEOUS) ×3 IMPLANT
PACK CATARACT DINGLEDEIN LX (MISCELLANEOUS) ×3 IMPLANT
PACK EYE AFTER SURG (MISCELLANEOUS) ×3 IMPLANT
SHLD EYE VISITEC  UNIV (MISCELLANEOUS) ×3 IMPLANT
SOL PREP PVP 2OZ (MISCELLANEOUS) ×3
SOLUTION PREP PVP 2OZ (MISCELLANEOUS) ×1 IMPLANT
SUT SILK 5-0 (SUTURE) ×3 IMPLANT
SYR 5ML LL (SYRINGE) ×3 IMPLANT
SYR TB 1ML 27GX1/2 LL (SYRINGE) ×3 IMPLANT
WATER STERILE IRR 1000ML POUR (IV SOLUTION) ×3 IMPLANT
WIPE NON LINTING 3.25X3.25 (MISCELLANEOUS) ×3 IMPLANT
acrysof lens 19.5 ×3 IMPLANT

## 2015-03-29 NOTE — Interval H&P Note (Signed)
History and Physical Interval Note:  03/29/2015 10:20 AM  [name]  has presented today for surgery, with the diagnosis of CATARACT  The various methods of treatment have been discussed with the patient and family. After consideration of risks, benefits and other options for treatment, the patient has consented to  Procedure(s): CATARACT EXTRACTION PHACO AND INTRAOCULAR LENS PLACEMENT (IOC) (Right) as a surgical intervention .  The patient's history has been reviewed, patient examined, no change in status, stable for surgery.  I have reviewed the patient's chart and labs.  Questions were answered to the patient's satisfaction.     [name]

## 2015-03-29 NOTE — Transfer of Care (Signed)
Immediate Anesthesia Transfer of Care Note  Patient: [name]  Procedure(s) Performed: Procedure(s) with comments: CATARACT EXTRACTION PHACO AND INTRAOCULAR LENS PLACEMENT (IOC) (Right) - [place] 00:40 AP% 20.2 CDE 15.83  Patient Location: PACU  Anesthesia Type:MAC  Level of Consciousness: awake, alert  and oriented  Airway & Oxygen Therapy: Patient Spontanous Breathing  Post-op Assessment: Report given to RN and Post -op Vital signs reviewed and stable  Post vital signs: Reviewed and stable  Last Vitals:  Filed Vitals:   03/29/15 1104  BP: 150/79  Pulse:   Temp: 36.2 C  Resp: 14    Complications: No apparent anesthesia complications

## 2015-03-29 NOTE — Anesthesia Postprocedure Evaluation (Signed)
  Anesthesia Post-op Note  Patient: [name]  Procedure(s) Performed: Procedure(s) with comments: CATARACT EXTRACTION PHACO AND INTRAOCULAR LENS PLACEMENT (IOC) (Right) - [place] 00:40 AP% 20.2 CDE 15.83  Anesthesia type:MAC  Patient location: PACU  Post pain: Pain level controlled  Post assessment: Post-op Vital signs reviewed, Patient's Cardiovascular Status Stable, Respiratory Function Stable, Patent Airway and No signs of Nausea or vomiting  Post vital signs: Reviewed and stable  Last Vitals:  Filed Vitals:   03/29/15 1110  BP: 140/73  Pulse: 54  Temp:   Resp: 16    Level of consciousness: awake, alert  and patient cooperative  Complications: No apparent anesthesia complications

## 2015-03-29 NOTE — Anesthesia Preprocedure Evaluation (Signed)
Anesthesia Evaluation  Patient identified by MRN, date of birth, ID band Patient awake    Reviewed: Allergy & Precautions, NPO status , Patient's Chart, lab work & pertinent test results  Airway Mallampati: I  TM Distance: >3 FB Neck ROM: Full    Dental  (+) Teeth Intact   Pulmonary Current Smoker,    Pulmonary exam normal       Cardiovascular Normal cardiovascular exam    Neuro/Psych Restless legs--quite severe.    GI/Hepatic   Endo/Other    Renal/GU      Musculoskeletal   Abdominal (+) + obese,   Peds  Hematology   Anesthesia Other Findings   Reproductive/Obstetrics                             Anesthesia Physical Anesthesia Plan  ASA: II  Anesthesia Plan: MAC   Post-op Pain Management:    Induction:   Airway Management Planned: Nasal Cannula  Additional Equipment:   Intra-op Plan:   Post-operative Plan:   Informed Consent: I have reviewed the patients History and Physical, chart, labs and discussed the procedure including the risks, benefits and alternatives for the proposed anesthesia with the patient or authorized representative who has indicated his/her understanding and acceptance.     Plan Discussed with: CRNA  Anesthesia Plan Comments:         Anesthesia Quick Evaluation

## 2015-03-29 NOTE — H&P (Signed)
  History and physical was faxed and scanned in.   

## 2015-03-29 NOTE — Op Note (Signed)
Date of Surgery: 03/29/2015 Date of Dictation: 03/29/2015 11:02 AM Pre-operative Diagnosis:  Nuclear Sclerotic Cataract right Eye Post-operative Diagnosis: same Procedure performed: Extra-capsular Cataract Extraction (ECCE) with placement of a posterior chamber intraocular lens (IOL) right Eye IOL:  Implant Name Type Inv. Item Serial No. Manufacturer Lot No. LRB No. Used  acrysof lens 19.5     [id number]     Right 1   Anesthesia: 2% Lidocaine and 4% Marcaine in a 50/50 mixture with 10 unites/ml of Hylenex given as a peribulbar Anesthesiologist: Anesthesiologist: Linward Natal, MD CRNA: Darrol Jump, CRNA; Lily Kocher, CRNA Complications: none Estimated Blood Loss: less than 1 ml  Description of procedure:  The patient was given anesthesia and sedation via intravenous access. The patient was then prepped and draped in the usual fashion. A 25-gauge needle was bent for initiating the capsulorhexis. A 5-0 silk suture was placed through the conjunctiva superior and inferiorly to serve as bridle sutures. Hemostasis was obtained at the superior limbus using an eraser cautery. A partial thickness groove was made at the anterior surgical limbus with a 64 Beaver blade and this was dissected anteriorly with an [employer]. The anterior chamber was entered at 10 o'clock with a 1.0 mm paracentesis knife and through the lamellar dissection with a 2.6 mm Alcon keratome. DiscoVisc was injected to replace the aqueous and a continuous tear curvilinear capsulorhexis was performed using a bent 25-gauge needle.  Balance salt on a syringe was used to perform hydro-dissection and phacoemulsification was carried out using a divide and conquer technique. Procedure(s) with comments: CATARACT EXTRACTION PHACO AND INTRAOCULAR LENS PLACEMENT (IOC) (Right) - [place] 00:40 AP% 20.2 CDE 15.83. Irrigation/aspiration was used to remove the residual cortex and the capsular bag was inflated with DiscoVisc. The intraocular lens  was inserted into the capsular bag using a pre-loaded Acrysert Delivery System. Irrigation/aspiration was used to remove the residual DiscoVisc. The wound was inflated with balanced salt and checked for leaks. None were found. Miostat was injected via the paracentesis track and 0.1 ml of cefuroxime containing 1 mg of drug  was injected via the paracentesis track. The wound was checked for leaks again and none were found.   The bridal sutures were removed and two drops of Vigamox were placed on the eye. An eye shield was placed to protect the eye and the patient was discharged to the recovery area in good condition.   Persephone Schriever MD

## 2015-03-29 NOTE — Discharge Instructions (Addendum)
See handout Eye Surgery Discharge Instructions  Expect mild scratchy sensation or mild soreness. DO NOT RUB YOUR EYE!  The day of surgery:  Minimal physical activity, but bed rest is not required  No reading, computer work, or close hand work  No bending, lifting, or straining.  May watch TV  For 24 hours:  No driving, legal decisions, or alcoholic beverages  Safety precautions  Eat anything you prefer: It is better to start with liquids, then soup then solid foods.  _____ Eye patch should be worn until postoperative exam tomorrow.  ____ Solar shield eyeglasses should be worn for comfort in the sunlight/patch while sleeping  Resume all regular medications including aspirin or Coumadin if these were discontinued prior to surgery. You may shower, bathe, shave, or wash your hair. Tylenol may be taken for mild discomfort.  Call your doctor if you experience significant pain, nausea, or vomiting, fever > 101 or other signs of infection. [phone] or [phone] Specific instructions:  Follow-up Information    Follow up On 03/30/2015.   Why:  11:00

## 2015-03-29 NOTE — Anesthesia Postprocedure Evaluation (Signed)
  Anesthesia Post-op Note  Patient: [name]  Procedure(s) Performed: Procedure(s) with comments: CATARACT EXTRACTION PHACO AND INTRAOCULAR LENS PLACEMENT (IOC) (Right) - [place] 00:40 AP% 20.2 CDE 15.83  Anesthesia type:MAC  Patient location: PACU  Post pain: Pain level controlled  Post assessment: Post-op Vital signs reviewed, Patient's Cardiovascular Status Stable, Respiratory Function Stable, Patent Airway and No signs of Nausea or vomiting  Post vital signs: Reviewed and stable  Last Vitals:  Filed Vitals:   03/29/15 1104  BP: 150/79  Pulse:   Temp: 36.2 C  Resp: 14    Level of consciousness: awake, alert  and patient cooperative  Complications: No apparent anesthesia complications

## 2015-04-13 ENCOUNTER — Inpatient Hospital Stay: Admission: RE | Admit: 2015-04-13 | Payer: Medicare Other | Source: Ambulatory Visit

## 2015-04-15 ENCOUNTER — Encounter: Payer: Self-pay | Admitting: *Deleted

## 2015-04-15 DIAGNOSIS — G8929 Other chronic pain: Secondary | ICD-10-CM | POA: Diagnosis not present

## 2015-04-15 DIAGNOSIS — Z9841 Cataract extraction status, right eye: Secondary | ICD-10-CM | POA: Diagnosis not present

## 2015-04-15 DIAGNOSIS — M549 Dorsalgia, unspecified: Secondary | ICD-10-CM | POA: Diagnosis not present

## 2015-04-15 DIAGNOSIS — H2511 Age-related nuclear cataract, right eye: Secondary | ICD-10-CM | POA: Diagnosis not present

## 2015-04-15 DIAGNOSIS — F419 Anxiety disorder, unspecified: Secondary | ICD-10-CM | POA: Diagnosis not present

## 2015-04-15 DIAGNOSIS — Z882 Allergy status to sulfonamides status: Secondary | ICD-10-CM | POA: Diagnosis not present

## 2015-04-15 DIAGNOSIS — M797 Fibromyalgia: Secondary | ICD-10-CM | POA: Diagnosis not present

## 2015-04-15 DIAGNOSIS — G2581 Restless legs syndrome: Secondary | ICD-10-CM | POA: Diagnosis not present

## 2015-04-15 DIAGNOSIS — F329 Major depressive disorder, single episode, unspecified: Secondary | ICD-10-CM | POA: Diagnosis not present

## 2015-04-15 DIAGNOSIS — M1990 Unspecified osteoarthritis, unspecified site: Secondary | ICD-10-CM | POA: Diagnosis not present

## 2015-04-15 DIAGNOSIS — K5790 Diverticulosis of intestine, part unspecified, without perforation or abscess without bleeding: Secondary | ICD-10-CM | POA: Diagnosis not present

## 2015-04-15 DIAGNOSIS — Z90710 Acquired absence of both cervix and uterus: Secondary | ICD-10-CM | POA: Diagnosis not present

## 2015-04-15 DIAGNOSIS — F17200 Nicotine dependence, unspecified, uncomplicated: Secondary | ICD-10-CM | POA: Diagnosis not present

## 2015-04-25 NOTE — H&P (Signed)
  History and physical was faxed and scanned in.   

## 2015-04-26 ENCOUNTER — Encounter: Payer: Self-pay | Admitting: *Deleted

## 2015-04-26 ENCOUNTER — Ambulatory Visit: Payer: Medicare Other | Admitting: *Deleted

## 2015-04-26 ENCOUNTER — Ambulatory Visit
Admission: RE | Admit: 2015-04-26 | Discharge: 2015-04-26 | Disposition: A | Discharge status: Home / Self Care | Payer: Medicare Other | Source: Ambulatory Visit | Attending: Ophthalmology | Admitting: Ophthalmology

## 2015-04-26 ENCOUNTER — Encounter
Admission: RE | Disposition: A | Discharge status: Home / Self Care | Payer: Self-pay | Source: Ambulatory Visit | Attending: Ophthalmology

## 2015-04-26 DIAGNOSIS — G2581 Restless legs syndrome: Secondary | ICD-10-CM | POA: Insufficient documentation

## 2015-04-26 DIAGNOSIS — H2511 Age-related nuclear cataract, right eye: Secondary | ICD-10-CM | POA: Insufficient documentation

## 2015-04-26 DIAGNOSIS — F17200 Nicotine dependence, unspecified, uncomplicated: Secondary | ICD-10-CM | POA: Insufficient documentation

## 2015-04-26 DIAGNOSIS — Z882 Allergy status to sulfonamides status: Secondary | ICD-10-CM | POA: Insufficient documentation

## 2015-04-26 DIAGNOSIS — Z9841 Cataract extraction status, right eye: Secondary | ICD-10-CM | POA: Insufficient documentation

## 2015-04-26 DIAGNOSIS — M1990 Unspecified osteoarthritis, unspecified site: Secondary | ICD-10-CM | POA: Insufficient documentation

## 2015-04-26 DIAGNOSIS — F329 Major depressive disorder, single episode, unspecified: Secondary | ICD-10-CM | POA: Insufficient documentation

## 2015-04-26 DIAGNOSIS — K5790 Diverticulosis of intestine, part unspecified, without perforation or abscess without bleeding: Secondary | ICD-10-CM | POA: Insufficient documentation

## 2015-04-26 DIAGNOSIS — Z90710 Acquired absence of both cervix and uterus: Secondary | ICD-10-CM | POA: Insufficient documentation

## 2015-04-26 DIAGNOSIS — F419 Anxiety disorder, unspecified: Secondary | ICD-10-CM | POA: Insufficient documentation

## 2015-04-26 DIAGNOSIS — M549 Dorsalgia, unspecified: Secondary | ICD-10-CM | POA: Insufficient documentation

## 2015-04-26 DIAGNOSIS — G8929 Other chronic pain: Secondary | ICD-10-CM | POA: Insufficient documentation

## 2015-04-26 DIAGNOSIS — M797 Fibromyalgia: Secondary | ICD-10-CM | POA: Insufficient documentation

## 2015-04-26 HISTORY — DX: Diverticulitis of intestine, part unspecified, without perforation or abscess without bleeding: K57.92

## 2015-04-26 HISTORY — DX: Other cervical disc degeneration, unspecified cervical region: M50.30

## 2015-04-26 HISTORY — PX: CATARACT EXTRACTION W/PHACO: SHX586

## 2015-04-26 HISTORY — DX: Diverticulosis of intestine, part unspecified, without perforation or abscess without bleeding: K57.90

## 2015-04-26 SURGERY — PHACOEMULSIFICATION, CATARACT, WITH IOL INSERTION
Anesthesia: Monitor Anesthesia Care | Laterality: Left | Wound class: Clean

## 2015-04-26 MED ORDER — CYCLOPENTOLATE HCL 2 % OP SOLN
1.0000 [drp] | OPHTHALMIC | Status: AC
  Administered 2015-04-26 (×4): 1 [drp] via OPHTHALMIC

## 2015-04-26 MED ORDER — EPINEPHRINE HCL 1 MG/ML IJ SOLN
INTRAMUSCULAR | Status: AC
  Filled 2015-04-26: qty 2

## 2015-04-26 MED ORDER — MOXIFLOXACIN HCL 0.5 % OP SOLN
1.0000 [drp] | OPHTHALMIC | Status: AC
  Administered 2015-04-26 (×3): 1 [drp] via OPHTHALMIC

## 2015-04-26 MED ORDER — CEFUROXIME OPHTHALMIC INJECTION 1 MG/0.1 ML
INJECTION | OPHTHALMIC | Status: AC
  Filled 2015-04-26: qty 0.1

## 2015-04-26 MED ORDER — CEFUROXIME OPHTHALMIC INJECTION 1 MG/0.1 ML
INJECTION | OPHTHALMIC | Status: DC | PRN
  Administered 2015-04-26: 0.1 mL via INTRACAMERAL

## 2015-04-26 MED ORDER — TETRACAINE HCL 0.5 % OP SOLN
OPHTHALMIC | Status: DC | PRN
  Administered 2015-04-26: 1 [drp] via OPHTHALMIC

## 2015-04-26 MED ORDER — NA CHONDROIT SULF-NA HYALURON 40-17 MG/ML IO SOLN
INTRAOCULAR | Status: DC | PRN
  Administered 2015-04-26: 1 mL via INTRAOCULAR

## 2015-04-26 MED ORDER — PHENYLEPHRINE HCL 10 % OP SOLN
OPHTHALMIC | Status: AC
  Administered 2015-04-26: 1 [drp] via OPHTHALMIC
  Filled 2015-04-26: qty 5

## 2015-04-26 MED ORDER — PHENYLEPHRINE HCL 10 % OP SOLN
1.0000 [drp] | OPHTHALMIC | Status: AC
  Administered 2015-04-26 (×4): 1 [drp] via OPHTHALMIC

## 2015-04-26 MED ORDER — EPINEPHRINE HCL 1 MG/ML IJ SOLN
INTRAOCULAR | Status: DC | PRN
  Administered 2015-04-26: 200 mL

## 2015-04-26 MED ORDER — HYDROCODONE-ACETAMINOPHEN 10-325 MG PO TABS
1.0000 | ORAL_TABLET | Freq: Once | ORAL | Status: AC
  Administered 2015-04-26: 1 via ORAL

## 2015-04-26 MED ORDER — MOXIFLOXACIN HCL 0.5 % OP SOLN - NO CHARGE
OPHTHALMIC | Status: DC | PRN
  Administered 2015-04-26: 1 [drp] via OPHTHALMIC

## 2015-04-26 MED ORDER — TETRACAINE HCL 0.5 % OP SOLN
OPHTHALMIC | Status: AC
  Filled 2015-04-26: qty 2

## 2015-04-26 MED ORDER — LACTATED RINGERS IV SOLN
INTRAVENOUS | Status: DC
  Administered 2015-04-26: 12:00:00 via INTRAVENOUS

## 2015-04-26 MED ORDER — ALFENTANIL 500 MCG/ML IJ INJ
INJECTION | INTRAMUSCULAR | Status: DC | PRN
  Administered 2015-04-26: 500 ug via INTRAVENOUS

## 2015-04-26 MED ORDER — CARBACHOL 0.01 % IO SOLN
INTRAOCULAR | Status: DC | PRN
  Administered 2015-04-26: 0.5 mL via INTRAOCULAR

## 2015-04-26 MED ORDER — MOXIFLOXACIN HCL 0.5 % OP SOLN
OPHTHALMIC | Status: AC
  Administered 2015-04-26: 1 [drp] via OPHTHALMIC
  Filled 2015-04-26: qty 3

## 2015-04-26 MED ORDER — BUPIVACAINE HCL (PF) 0.75 % IJ SOLN
INTRAMUSCULAR | Status: AC
  Filled 2015-04-26: qty 10

## 2015-04-26 MED ORDER — HYDROCODONE-ACETAMINOPHEN 10-325 MG PO TABS
ORAL_TABLET | ORAL | Status: AC
  Filled 2015-04-26: qty 1

## 2015-04-26 MED ORDER — FENTANYL CITRATE (PF) 100 MCG/2ML IJ SOLN
INTRAMUSCULAR | Status: DC | PRN
  Administered 2015-04-26: 25 ug via INTRAVENOUS

## 2015-04-26 MED ORDER — CYCLOPENTOLATE HCL 2 % OP SOLN
OPHTHALMIC | Status: AC
  Administered 2015-04-26: 1 [drp] via OPHTHALMIC
  Filled 2015-04-26: qty 2

## 2015-04-26 MED ORDER — NA CHONDROIT SULF-NA HYALURON 40-17 MG/ML IO SOLN
INTRAOCULAR | Status: AC
  Filled 2015-04-26: qty 1

## 2015-04-26 MED ORDER — MIDAZOLAM HCL 2 MG/2ML IJ SOLN
INTRAMUSCULAR | Status: DC | PRN
  Administered 2015-04-26: 1 mg via INTRAVENOUS

## 2015-04-26 MED ORDER — HYALURONIDASE HUMAN 150 UNIT/ML IJ SOLN
INTRAMUSCULAR | Status: AC
  Filled 2015-04-26: qty 1

## 2015-04-26 MED ORDER — LIDOCAINE HCL (PF) 4 % IJ SOLN
INTRAMUSCULAR | Status: AC
  Filled 2015-04-26: qty 10

## 2015-04-26 SURGICAL SUPPLY — 25 items
CORD BIP STRL DISP 12FT (MISCELLANEOUS) ×3 IMPLANT
DRAPE XRAY CASSETTE 23X24 (DRAPES) ×3 IMPLANT
ERASER HMR WETFIELD 18G (MISCELLANEOUS) ×3 IMPLANT
GLOVE BIO SURGEON STRL SZ8 (GLOVE) ×3 IMPLANT
GLOVE SURG LX 6.5 MICRO (GLOVE) ×2
GLOVE SURG LX 8.0 MICRO (GLOVE) ×2
GLOVE SURG LX STRL 6.5 MICRO (GLOVE) ×1 IMPLANT
GLOVE SURG LX STRL 8.0 MICRO (GLOVE) ×1 IMPLANT
GOWN STRL REUS W/ TWL LRG LVL3 (GOWN DISPOSABLE) ×1 IMPLANT
GOWN STRL REUS W/ TWL XL LVL3 (GOWN DISPOSABLE) ×1 IMPLANT
GOWN STRL REUS W/TWL LRG LVL3 (GOWN DISPOSABLE) ×2
GOWN STRL REUS W/TWL XL LVL3 (GOWN DISPOSABLE) ×2
LENS IOL ACRYSERT 19.5 (Intraocular Lens) ×3 IMPLANT
PACK CATARACT (MISCELLANEOUS) ×3 IMPLANT
PACK CATARACT DINGLEDEIN LX (MISCELLANEOUS) ×3 IMPLANT
PACK EYE AFTER SURG (MISCELLANEOUS) ×3 IMPLANT
SHLD EYE VISITEC  UNIV (MISCELLANEOUS) ×3 IMPLANT
SOL PREP PVP 2OZ (MISCELLANEOUS) ×3
SOLUTION PREP PVP 2OZ (MISCELLANEOUS) ×1 IMPLANT
SUT SILK 5-0 (SUTURE) ×3 IMPLANT
SYR 5ML LL (SYRINGE) ×3 IMPLANT
SYR TB 1ML 27GX1/2 LL (SYRINGE) ×3 IMPLANT
WATER STERILE IRR 1000ML POUR (IV SOLUTION) ×3 IMPLANT
WIPE NON LINTING 3.25X3.25 (MISCELLANEOUS) ×3 IMPLANT
sn6cws 19.5 ×3 IMPLANT

## 2015-04-26 NOTE — Anesthesia Procedure Notes (Signed)
Procedure Name: MAC Date/Time: 04/26/2015 12:50 PM Performed by: Henrietta HooverPOPE, Vipul Cafarelli Pre-anesthesia Checklist: Patient identified, Emergency Drugs available, Suction available, Patient being monitored and Timeout performed Oxygen Delivery Method: Nasal cannula

## 2015-04-26 NOTE — Interval H&P Note (Signed)
History and Physical Interval Note:  04/26/2015 12:30 PM  Debra SalkLinda H Weight  has presented today for surgery, with the diagnosis of CATARACT  The various methods of treatment have been discussed with the patient and family. After consideration of risks, benefits and other options for treatment, the patient has consented to  Procedure(s): CATARACT EXTRACTION PHACO AND INTRAOCULAR LENS PLACEMENT (IOC) (Left) as a surgical intervention .  The patient's history has been reviewed, patient examined, no change in status, stable for surgery.  I have reviewed the patient's chart and labs.  Questions were answered to the patient's satisfaction.     [name]

## 2015-04-26 NOTE — Anesthesia Postprocedure Evaluation (Signed)
  Anesthesia Post-op Note  Patient: Debra SalkLinda H Pace  Procedure(s) Performed: Procedure(s) with comments: CATARACT EXTRACTION PHACO AND INTRAOCULAR LENS PLACEMENT (IOC) (Left) - us 00:44 ap 55.3 cde 18.64  Anesthesia type:MAC  Patient location: PACU  Post pain: Pain level controlled  Post assessment: Post-op Vital signs reviewed, Patient's Cardiovascular Status Stable, Respiratory Function Stable, Patent Airway and No signs of Nausea or vomiting  Post vital signs: Reviewed and stable  Last Vitals:  Filed Vitals:   04/26/15 1125  BP: 128/83  Pulse: 71  Temp: 37 C  Resp: 16    Level of consciousness: awake, alert  and patient cooperative  Complications: No apparent anesthesia complications

## 2015-04-26 NOTE — Transfer of Care (Signed)
Immediate Anesthesia Transfer of Care Note  Patient: Debra SalkLinda H Pace  Procedure(s) Performed: Procedure(s) with comments: CATARACT EXTRACTION PHACO AND INTRAOCULAR LENS PLACEMENT (IOC) (Left) - us 00:44 ap 55.3 cde 18.64  Patient Location: PACU  Anesthesia Type:MAC  Level of Consciousness: awake  Airway & Oxygen Therapy: Patient Spontanous Breathing  Post-op Assessment: Report given to RN and Post -op Vital signs reviewed and stable  Post vital signs: Reviewed and stable  Last Vitals:  Filed Vitals:   04/26/15 1125  BP: 128/83  Pulse: 71  Temp: 37 C  Resp: 16    Complications: No apparent anesthesia complications

## 2015-04-26 NOTE — Op Note (Signed)
Date of Surgery: 04/26/2015 Date of Dictation: 04/26/2015 1:25 PM Pre-operative Diagnosis:  Nuclear Sclerotic Cataract right Eye Post-operative Diagnosis: same Procedure performed: Extra-capsular Cataract Extraction (ECCE) with placement of a posterior chamber intraocular lens (IOL) right Eye IOL: * No implants in log * Anesthesia: 2% Lidocaine and 4% Marcaine in a 50/50 mixture with 10 unites/ml of Hylenex given as a peribulbar Anesthesiologist: Anesthesiologist: Linward NatalAmy Rice, MD CRNA: Henrietta HooverKimberly Pope, CRNA Complications: none Estimated Blood Loss: less than 1 ml  Description of procedure:  The patient was given anesthesia and sedation via intravenous access. The patient was then prepped and draped in the usual fashion. A 25-gauge needle was bent for initiating the capsulorhexis. A 5-0 silk suture was placed through the conjunctiva superior and inferiorly to serve as bridle sutures. Hemostasis was obtained at the superior limbus using an eraser cautery. A partial thickness groove was made at the anterior surgical limbus with a 64 Beaver blade and this was dissected anteriorly with an SYSCOlcon Crescent knife. The anterior chamber was entered at 10 o'clock with a 1.0 mm paracentesis knife and through the lamellar dissection with a 2.6 mm Alcon keratome. DiscoVisc was injected to replace the aqueous and a continuous tear curvilinear capsulorhexis was performed using a bent 25-gauge needle.  Balance salt on a syringe was used to perform hydro-dissection and phacoemulsification was carried out using a divide and conquer technique. Procedure(s) with comments: CATARACT EXTRACTION PHACO AND INTRAOCULAR LENS PLACEMENT (IOC) (Left) - us 00:44 ap 55.3 cde 18.64. Irrigation/aspiration was used to remove the residual cortex and the capsular bag was inflated with DiscoVisc. The intraocular lens was inserted into the capsular bag using a pre-loaded Monarch Delivery System. Irrigation/aspiration was used to remove the  residual DiscoVisc. The wound was inflated with balanced salt and checked for leaks. None were found. Miostat was injected via the paracentesis track and 0.1 ml of cefuroxime containing 1 mg of drug  was injected via the paracentesis track. The wound was checked for leaks again and none were found.   The bridal sutures were removed and two drops of Vigamox were placed on the eye. An eye shield was placed to protect the eye and the patient was discharged to the recovery area in good condition.   Tennelle Taflinger MD

## 2015-04-26 NOTE — Anesthesia Preprocedure Evaluation (Signed)
Anesthesia Evaluation  Patient identified by MRN, date of birth, ID band Patient awake    Reviewed: Allergy & Precautions, NPO status , Patient's Chart, lab work & pertinent test results  Airway Mallampati: II  TM Distance: >3 FB Neck ROM: Full    Dental  (+) Teeth Intact   Pulmonary Current Smoker,    Pulmonary exam normal       Cardiovascular Exercise Tolerance: Good Normal cardiovascular exam    Neuro/Psych Restless leg syndrome.    GI/Hepatic   Endo/Other    Renal/GU      Musculoskeletal   Abdominal Normal abdominal exam  (+)   Peds  Hematology   Anesthesia Other Findings   Reproductive/Obstetrics                             Anesthesia Physical Anesthesia Plan  ASA: II  Anesthesia Plan: MAC   Post-op Pain Management:    Induction: Intravenous  Airway Management Planned: Nasal Cannula  Additional Equipment:   Intra-op Plan:   Post-operative Plan:   Informed Consent: I have reviewed the patients History and Physical, chart, labs and discussed the procedure including the risks, benefits and alternatives for the proposed anesthesia with the patient or authorized representative who has indicated his/her understanding and acceptance.     Plan Discussed with: CRNA  Anesthesia Plan Comments:         Anesthesia Quick Evaluation

## 2015-04-26 NOTE — Discharge Instructions (Addendum)
See handout. Eye Surgery Discharge Instructions  Expect mild scratchy sensation or mild soreness. DO NOT RUB YOUR EYE!  The day of surgery:  Minimal physical activity, but bed rest is not required  No reading, computer work, or close hand work  No bending, lifting, or straining.  May watch TV  For 24 hours:  No driving, legal decisions, or alcoholic beverages  Safety precautions  Eat anything you prefer: It is better to start with liquids, then soup then solid foods.  _____ Eye patch should be worn until postoperative exam tomorrow.  ____ Solar shield eyeglasses should be worn for comfort in the sunlight/patch while sleeping  Resume all regular medications including aspirin or Coumadin if these were discontinued prior to surgery. You may shower, bathe, shave, or wash your hair. Tylenol may be taken for mild discomfort.  Call your doctor if you experience significant pain, nausea, or vomiting, fever > 101 or other signs of infection. [phone] or [phone] Specific instructions:  Follow-up Information    Follow up with Sallee LangeINGELDEIN,STEVEN, MD On 04/27/2015.   Specialty:  Ophthalmology   Why:  10:40   Contact information:   [address]   [place] [address] [phone]

## 2015-06-17 ENCOUNTER — Encounter (HOSPITAL_COMMUNITY): Payer: Self-pay | Admitting: General Surgery

## 2015-12-10 ENCOUNTER — Encounter: Payer: Self-pay | Admitting: Ophthalmology

## 2016-04-06 ENCOUNTER — Emergency Department
Admission: EM | Admit: 2016-04-06 | Discharge: 2016-04-06 | Disposition: A | Discharge status: Home / Self Care | Payer: Medicare Other | Attending: Emergency Medicine | Admitting: Emergency Medicine

## 2016-04-06 ENCOUNTER — Other Ambulatory Visit: Payer: Self-pay

## 2016-04-06 ENCOUNTER — Encounter: Payer: Self-pay | Admitting: Emergency Medicine

## 2016-04-06 ENCOUNTER — Emergency Department: Payer: Medicare Other

## 2016-04-06 DIAGNOSIS — M5136 Other intervertebral disc degeneration, lumbar region: Secondary | ICD-10-CM | POA: Insufficient documentation

## 2016-04-06 DIAGNOSIS — Y9339 Activity, other involving climbing, rappelling and jumping off: Secondary | ICD-10-CM | POA: Diagnosis not present

## 2016-04-06 DIAGNOSIS — M5030 Other cervical disc degeneration, unspecified cervical region: Secondary | ICD-10-CM | POA: Insufficient documentation

## 2016-04-06 DIAGNOSIS — M25561 Pain in right knee: Secondary | ICD-10-CM | POA: Insufficient documentation

## 2016-04-06 DIAGNOSIS — M545 Low back pain: Secondary | ICD-10-CM | POA: Diagnosis present

## 2016-04-06 DIAGNOSIS — Y929 Unspecified place or not applicable: Secondary | ICD-10-CM | POA: Diagnosis not present

## 2016-04-06 DIAGNOSIS — M179 Osteoarthritis of knee, unspecified: Secondary | ICD-10-CM | POA: Insufficient documentation

## 2016-04-06 DIAGNOSIS — Y999 Unspecified external cause status: Secondary | ICD-10-CM | POA: Diagnosis not present

## 2016-04-06 DIAGNOSIS — F329 Major depressive disorder, single episode, unspecified: Secondary | ICD-10-CM | POA: Diagnosis not present

## 2016-04-06 DIAGNOSIS — I10 Essential (primary) hypertension: Secondary | ICD-10-CM | POA: Diagnosis not present

## 2016-04-06 DIAGNOSIS — F17200 Nicotine dependence, unspecified, uncomplicated: Secondary | ICD-10-CM | POA: Diagnosis not present

## 2016-04-06 DIAGNOSIS — Z79899 Other long term (current) drug therapy: Secondary | ICD-10-CM | POA: Diagnosis not present

## 2016-04-06 DIAGNOSIS — W01198A Fall on same level from slipping, tripping and stumbling with subsequent striking against other object, initial encounter: Secondary | ICD-10-CM | POA: Insufficient documentation

## 2016-04-06 LAB — BASIC METABOLIC PANEL
ANION GAP: 7 (ref 5–15)
BUN: 11 mg/dL (ref 6–20)
CALCIUM: 9.5 mg/dL (ref 8.9–10.3)
CO2: 27 mmol/L (ref 22–32)
CREATININE: 0.75 mg/dL (ref 0.44–1.00)
Chloride: 104 mmol/L (ref 101–111)
Glucose, Bld: 99 mg/dL (ref 65–99)
Potassium: 4.2 mmol/L (ref 3.5–5.1)
SODIUM: 138 mmol/L (ref 135–145)

## 2016-04-06 LAB — CBC
HCT: 39.1 % (ref 35.0–47.0)
HEMOGLOBIN: 13.2 g/dL (ref 12.0–16.0)
MCH: 31.3 pg (ref 26.0–34.0)
MCHC: 33.8 g/dL (ref 32.0–36.0)
MCV: 92.6 fL (ref 80.0–100.0)
Platelets: 209 10*3/uL (ref 150–440)
RBC: 4.22 MIL/uL (ref 3.80–5.20)
RDW: 13.7 % (ref 11.5–14.5)
WBC: 8.1 10*3/uL (ref 3.6–11.0)

## 2016-04-06 IMAGING — DX DG KNEE COMPLETE 4+V*R*
4 series · 4 of 4 positions shown · non-contrast
Comparison: None.

CLINICAL DATA: Jumped on by a dog causing her to fall.

EXAM:
RIGHT KNEE - COMPLETE 4+ VIEW

[knee ap]
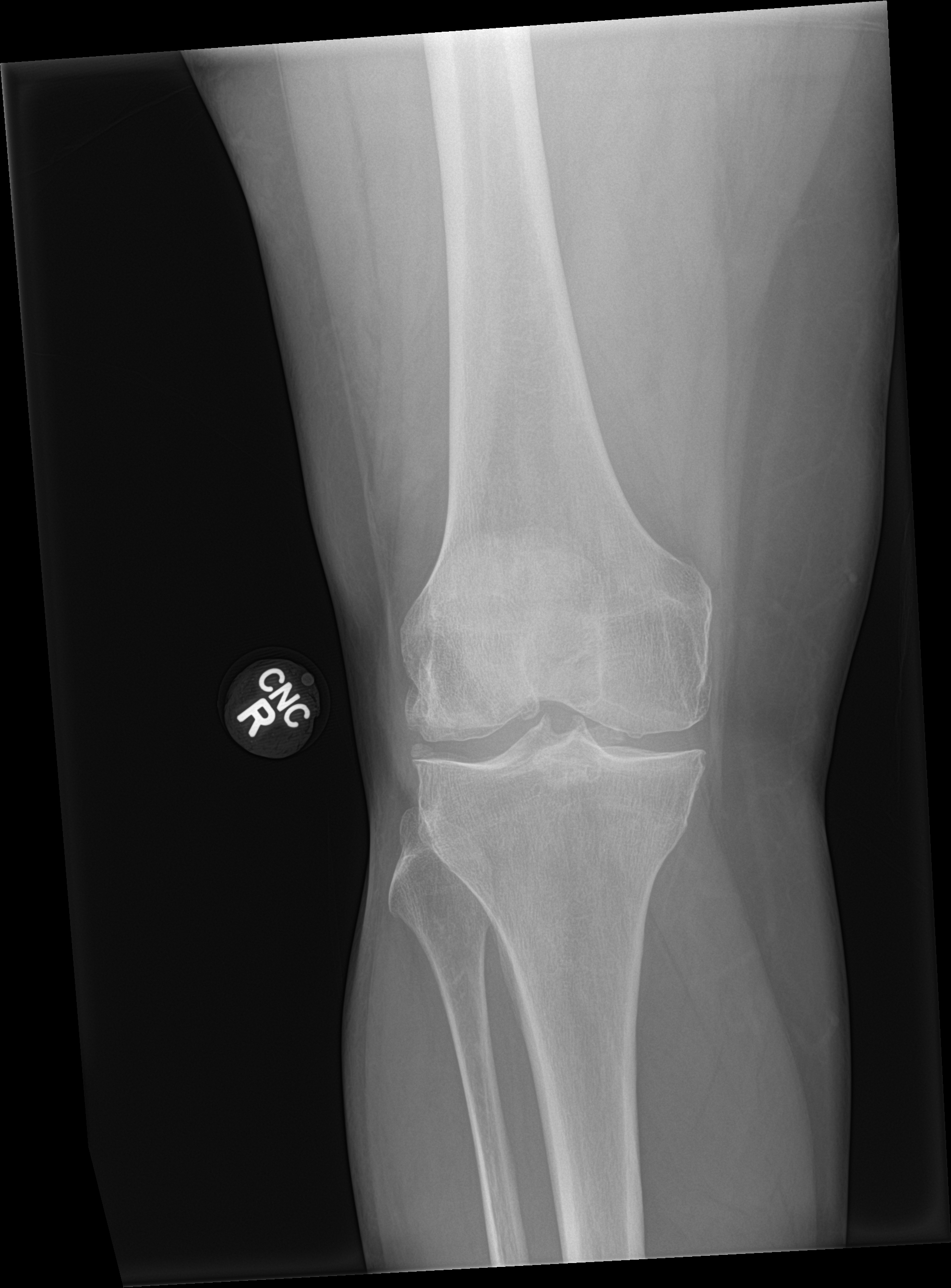

[knee lat]
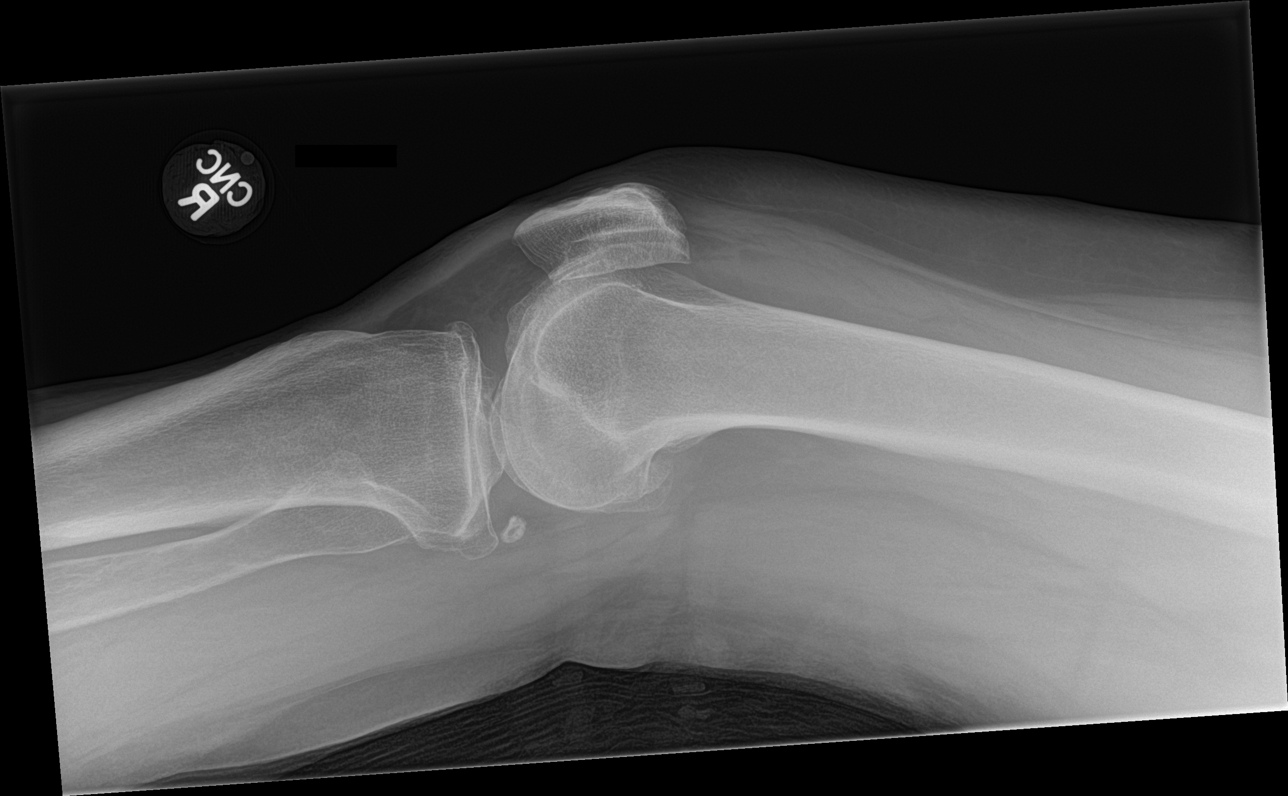

[knee obl (1 of 2)]
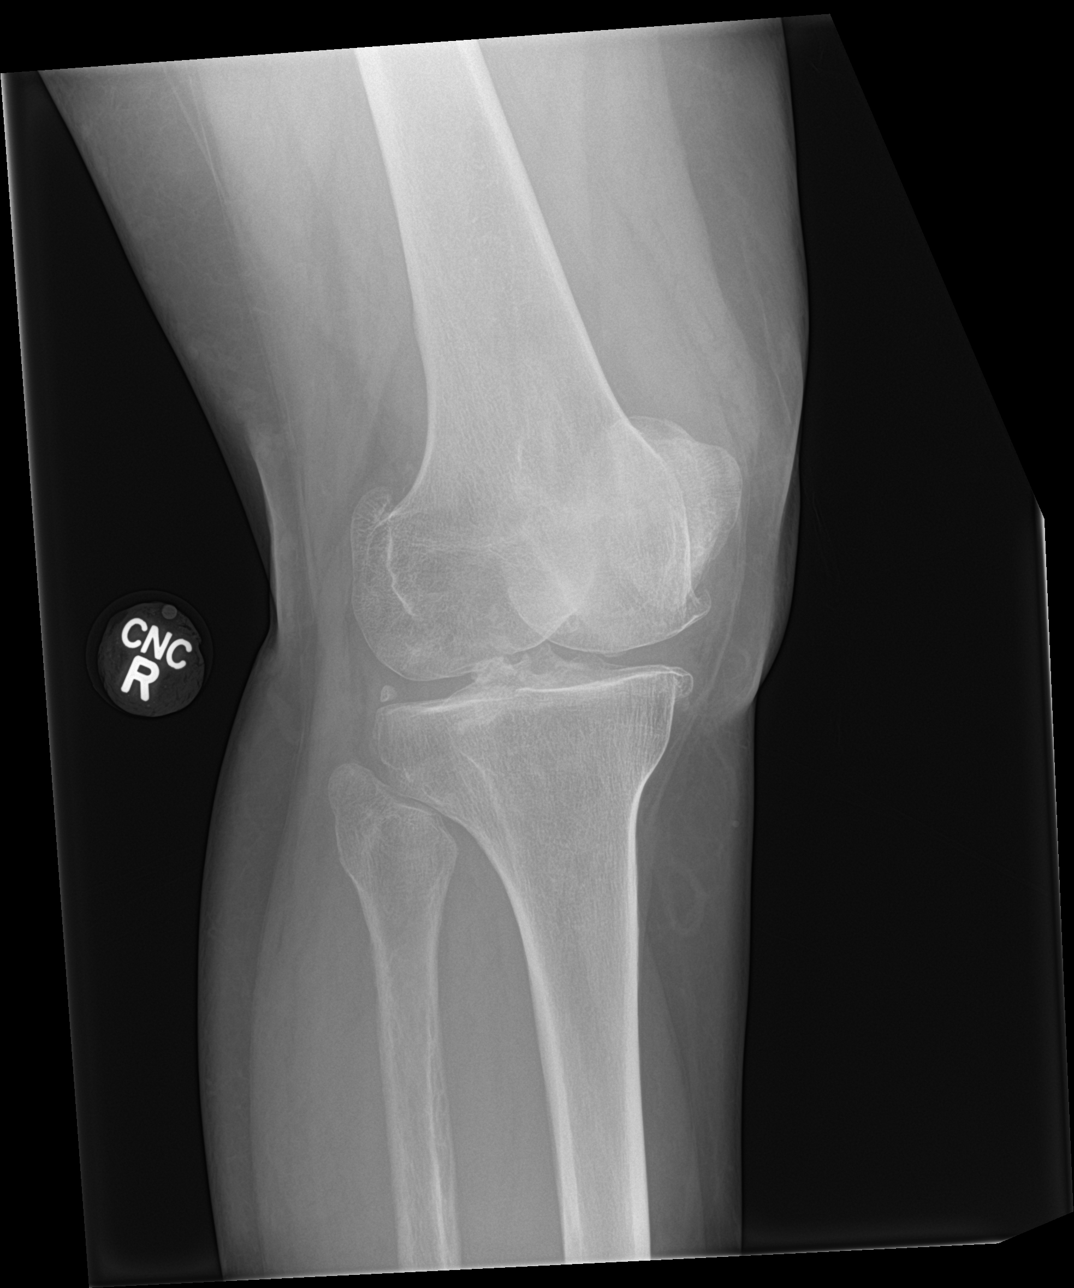

[knee obl (2 of 2)]
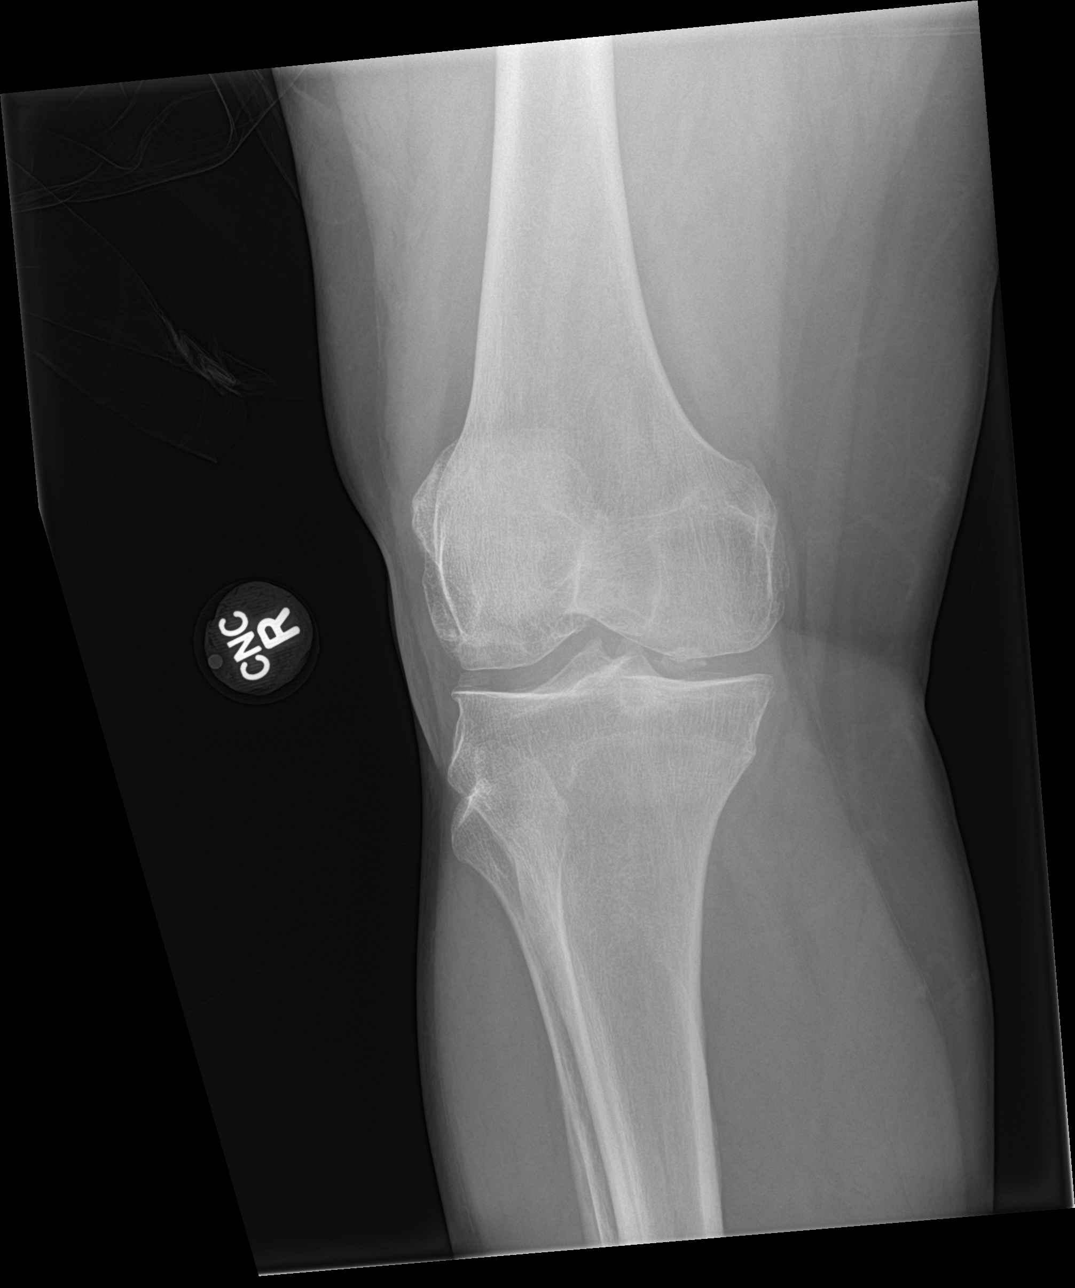

[4 of 4 positions shown; findings below may reference images not displayed]

FINDINGS: Small suprapatellar joint effusion. Patellofemoral and medial and
lateral compartment osteoarthritis is identified. There is a loose
body identified within the posterior joint space. No fracture or
dislocation.
IMPRESSION: 1. Moderate tricompartment osteoarthritis.
2. Small joint effusion.

## 2016-04-06 MED ORDER — ONDANSETRON HCL 4 MG/2ML IJ SOLN
4.0000 mg | Freq: Once | INTRAMUSCULAR | Status: AC
  Administered 2016-04-06: 4 mg via INTRAVENOUS
  Filled 2016-04-06: qty 2

## 2016-04-06 MED ORDER — OXYCODONE-ACETAMINOPHEN 5-325 MG PO TABS: 1.0000 | ORAL_TABLET | Freq: Four times a day (QID) | ORAL | Status: AC | PRN

## 2016-04-06 MED ORDER — DIPHENHYDRAMINE HCL 50 MG/ML IJ SOLN
50.0000 mg | Freq: Once | INTRAMUSCULAR | Status: AC
  Administered 2016-04-06: 50 mg via INTRAVENOUS

## 2016-04-06 MED ORDER — DIPHENHYDRAMINE HCL 50 MG/ML IJ SOLN
INTRAMUSCULAR | Status: AC
  Filled 2016-04-06: qty 1

## 2016-04-06 MED ORDER — HYDROMORPHONE HCL 1 MG/ML IJ SOLN
1.0000 mg | Freq: Once | INTRAMUSCULAR | Status: AC
  Administered 2016-04-06: 1 mg via INTRAVENOUS
  Filled 2016-04-06: qty 1

## 2016-04-06 NOTE — Discharge Instructions (Signed)

## 2016-04-06 NOTE — ED Notes (Signed)
Pt reports chronic knee and back pain, reports after the fall her back/knee are now hurting even more, states she passed out from the pain of the men picking up off the floor

## 2016-04-06 NOTE — ED Notes (Signed)
Pt states she was jumped on by a dog causing her to fall. Pt states she hit back on swing when she fell and is complaining of pain and swelling to right knee. Pt denies hitting head but states when they tried to stand her up she passed out.

## 2016-04-06 NOTE — ED Provider Notes (Signed)
[facility] Emergency Department Provider Note  Time seen: 2:15 PM  I have reviewed the triage vital signs and the nursing notes.   HISTORY  Chief Complaint Fall and Loss of Consciousness    HPI EVEA SHEEK is a 60 y.o. female with a past medical history of hypertension, IBS, fibromyalgia, chronic back pain, chronic knee pain, who presents the emergency department after a fall and syncopal episode. According to the patient she was outside with her dog when she tripped falling backwards. States her right knee got bent underneath of her. States significant right knee pain and swelling. She was helped to her feet but had significant pain felt lightheaded and had a brief syncopal episode. The patient states a history of frequent syncopal episodes in the past. Denies any chest pain now or anytime. Denies hitting her head. Denies any blood thinner use. Denies headache. Patient's main complaint is significant right knee pain, with mild lower back pain which she states is chronic. She has been receiving corticosteroid injections in her knees for knee pain, but states this is much worse. The patient has significant swelling in the knee.10/10 pain in the right knee, aching.     Past Medical History  Diagnosis Date  . Hypertension   . IBS (irritable bowel syndrome)   . Chronic back pain   . Restless leg syndrome   . Arthritis   . Depression   . Anxiety   . Back pain, chronic   . DDD (degenerative disc disease), lumbar   . Fibromyalgia   . RLS (restless legs syndrome)   . Pelvic cyst   . Diverticulitis   . Diverticulosis   . DDD (degenerative disc disease), cervical     Patient Active Problem List   Diagnosis Date Noted  . SPONDYLOSIS 01/11/2010  . H N P-LUMBAR 01/11/2010  . SPINAL STENOSIS 01/11/2010  . BACK PAIN 01/11/2010  . KNEE, ARTHRITIS, DEGEN./OSTEO 11/02/2008  . EFFUSION OF LOWER LEG JOINT 11/02/2008  . KNEE PAIN 11/02/2008    Past Surgical  History  Procedure Laterality Date  . Abdominal hysterectomy  1982  . Back injections    . Laparotomy  06/30/2011    Procedure: EXPLORATORY LAPAROTOMY;  Surgeon: Marlane Hatcher;  Location: [facility];  Service: General;  Laterality: N/A;  Exploratory Laparotomy, Excision of Pelvic Mass--Procedure started at 0848.  Marland Kitchen Cystoscopy w/ ureteral stent placement  06/30/2011    Procedure: CYSTOSCOPY WITH STENT REPLACEMENT;  Surgeon: Dennie Maizes;  Location: [facility];  Service: Urology;  Laterality: Bilateral;  Urethral Dilation, Cystoscopy with Bilateral Ureteral Catheter Placement started at 0815.  Ended at [phone].  Marland Kitchen Abdominal hysterectomy    . Cataract extraction w/phaco Right 03/29/2015    Procedure: CATARACT EXTRACTION PHACO AND INTRAOCULAR LENS PLACEMENT (IOC);  Surgeon: Sallee Lange, MD;  Location: [facility];  Service: Ophthalmology;  Laterality: Right;  [place] 00:40 AP% 20.2 CDE 15.83  . Pevic mass    . Cataract extraction w/phaco Left 04/26/2015    Procedure: CATARACT EXTRACTION PHACO AND INTRAOCULAR LENS PLACEMENT (IOC);  Surgeon: Sallee Lange, MD;  Location: [facility];  Service: Ophthalmology;  Laterality: Left;  [place] 00:44 ap 55.3 cde 18.64    Current Outpatient Rx  Name  Route  Sig  Dispense  Refill  . clonazePAM (KLONOPIN) 0.5 MG tablet   Oral   Take 0.5 mg by mouth at bedtime as needed. For restless leg          . clonazePAM (KLONOPIN) 0.5 MG tablet  Oral   Take 0.5 mg by mouth at bedtime.         . enalapril (VASOTEC) 5 MG tablet   Oral   Take 5 mg by mouth daily.           Marland Kitchen. FLUoxetine (PROZAC) 20 MG capsule   Oral   Take 20 mg by mouth 2 (two) times daily.           Marland Kitchen. FLUoxetine (PROZAC) 40 MG capsule   Oral   Take 40 mg by mouth daily.         Marland Kitchen. HYDROcodone-acetaminophen (NORCO) 10-325 MG per tablet   Oral   Take 1 tablet by mouth every 6 (six) hours as needed. For pain          . HYDROcodone-acetaminophen (NORCO) 10-325 MG per tablet   Oral   Take 1  tablet by mouth every 6 (six) hours as needed.         . metoprolol tartrate (LOPRESSOR) 25 MG tablet   Oral   Take 25 mg by mouth 2 (two) times daily.             Allergies Alprazolam; Sulfa antibiotics; and Sulfonamide derivatives  Family History  Problem Relation Age of Onset  . Anesthesia problems Neg Hx   . Hypotension Neg Hx   . Malignant hyperthermia Neg Hx   . Pseudochol deficiency Neg Hx     Social History Social History  Substance Use Topics  . Smoking status: Current Every Day Smoker -- 0.50 packs/day for 20 years  . Smokeless tobacco: None  . Alcohol Use: Yes     Comment: pt. states drinks wine/month    Review of Systems Constitutional: Negative for fever. Cardiovascular: Negative for chest pain. Respiratory: Negative for shortness of breath. Gastrointestinal: Negative for abdominal pain Genitourinary: Negative for dysuria. Musculoskeletal: Significant right knee pain, mild lower back pain. Skin: Negative for rash. Neurological: Negative for headache 10-point ROS otherwise negative.  ____________________________________________   PHYSICAL EXAM:  VITAL SIGNS: ED Triage Vitals  Enc Vitals Group     BP 04/06/16 1310 135/89 mmHg     Pulse Rate 04/06/16 1310 82     Resp 04/06/16 1310 20     Temp 04/06/16 1310 97.5 F (36.4 C)     Temp Source 04/06/16 1310 Oral     SpO2 04/06/16 1310 96 %     Weight 04/06/16 1310 195 lb (88.451 kg)     Height 04/06/16 1310 5\' 4"  (1.626 m)     Head Cir --      Peak Flow --      Pain Score 04/06/16 1311 7     Pain Loc --      Pain Edu? --      Excl. in GC? --     Constitutional: Alert and oriented. Well appearing and in no distress. Eyes: Normal exam ENT   Head: Normocephalic and atraumatic.   Mouth/Throat: Mucous membranes are moist. Cardiovascular: Normal rate, regular rhythm. No murmur Respiratory: Normal respiratory effort without tachypnea nor retractions. Breath sounds are  clear Gastrointestinal: Soft and nontender. No distention.  Musculoskeletal: Significant swelling consistent with joint effusion of the right knee, moderate tenderness palpation, significant pain with any attempted range of motion of the right knee. Mild L-spine tenderness palpation which the patient states is chronic. No C-spine or T-spine tenderness. Extremities otherwise atraumatic. Pelvis is stable without tenderness. Neurologic:  Normal speech and language. No gross focal neurologic deficits  Skin:  Skin is warm, dry and intact.  Psychiatric: Mood and affect are normal. Speech and behavior are normal.   ____________________________________________    EKG  EKG reviewed and interpreted by myself shows normal sinus rhythm at 82 bpm, narrow QRS, normal axis, normal intervals, no ST changes. Normal EKG.  ____________________________________________    RADIOLOGY  Knee negative for fracture. Positive for effusion.  ____________________________________________    INITIAL IMPRESSION / ASSESSMENT AND PLAN / ED COURSE  Pertinent labs & imaging results that were available during my care of the patient were reviewed by me and considered in my medical decision making (see chart for details).  The patient presents the emergency department after a fall with significant right knee pain. Patient states a brief syncopal episode due to the pain. Patient does have a significant joint effusion in the right knee on exam. Significant tenderness palpation and range of motion in the right knee. No hip tenderness. Mild lower back tenderness which patient states is chronic, denies any worsening. We will check basic labs, EKG, right knee x-ray  X-ray consistent with effusion and arthritis. We will apply a knee immobilizer. The patient has a walker at home. She'll be weightbearing as tolerated with orthopedic follow-up. We will discharge with oxycodone. Patient is agreeable to  plan.  ____________________________________________   FINAL CLINICAL IMPRESSION(S) / ED DIAGNOSES  Fall Right knee pain   Minna AntisKevin Karry Barrilleaux, MD 04/06/16 2015

## 2016-04-06 NOTE — ED Notes (Signed)
Pt reports allergic reaction to dilaudid, states that her face is itching, reports this happens whenever she receives narcotics and usually needs benadryl. EDP made aware and verbal orders received for 50mg  benadryl

## 2016-04-27 ENCOUNTER — Other Ambulatory Visit: Payer: Self-pay | Admitting: Orthopedic Surgery

## 2019-09-17 ENCOUNTER — Encounter: Payer: Self-pay | Admitting: Internal Medicine
